# Patient Record
Sex: Male | Born: 1960 | Race: Black or African American | Hispanic: No | Marital: Single | Smoking: Current every day smoker
Health system: Southern US, Community
[De-identification: ages and names within clinical notes are randomized; demographics above are authoritative.]

## PROBLEM LIST (undated history)

## (undated) DIAGNOSIS — E7800 Pure hypercholesterolemia, unspecified: Secondary | ICD-10-CM

## (undated) DIAGNOSIS — I10 Essential (primary) hypertension: Secondary | ICD-10-CM

## (undated) DIAGNOSIS — E119 Type 2 diabetes mellitus without complications: Secondary | ICD-10-CM

## (undated) HISTORY — PX: KNEE SURGERY: SHX244

## (undated) HISTORY — PX: EYE SURGERY: SHX253

## (undated) HISTORY — PX: HAND SURGERY: SHX662

## (undated) HISTORY — PX: FOOT SURGERY: SHX648

## (undated) HISTORY — DX: Essential (primary) hypertension: I10

---

## 2006-01-10 ENCOUNTER — Emergency Department: Payer: Self-pay | Admitting: Emergency Medicine

## 2009-12-27 ENCOUNTER — Emergency Department: Payer: Self-pay | Admitting: Emergency Medicine

## 2009-12-27 IMAGING — CT CT STONE STUDY
1 of 2 series · 15 of 32 positions shown, 19 images · non-contrast
Comparison: none

REASON FOR EXAM: Flank pain with hematuria
COMMENTS:   LMP: (Male)

PROCEDURE:     CT  - CT ABDOMEN /PELVIS WO (STONE)  - [DATE]  [DATE]
RESULT:     CT abdomen and pelvis dated [DATE].
TECHNIQUE: Helical 3 mm sections were obtained from the lung bases through the pubic
symphysis without the administration of oral or intravenous contrast.

[Series 2: stone · axial · 0.71mm/px · z∈[-498,-92]mm · 15 of 153 slices shown, 19 images]
[im 12/153  soft-tissue]
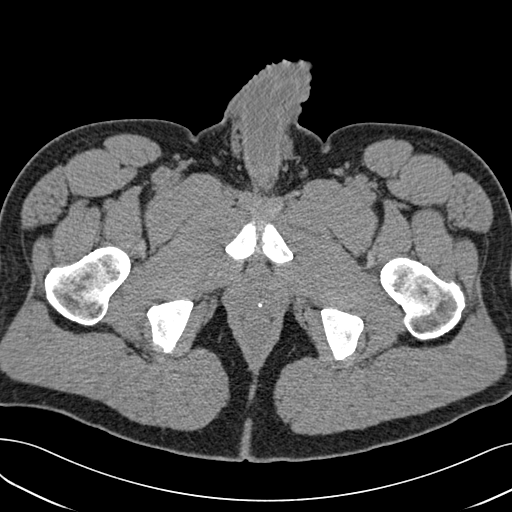
[im 12/153  bone]
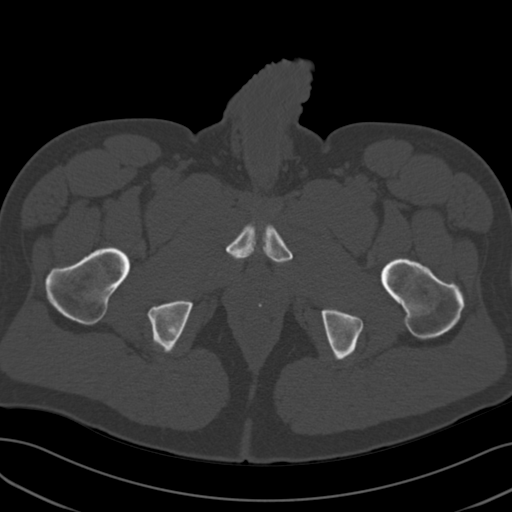
[im 23/153  soft-tissue]
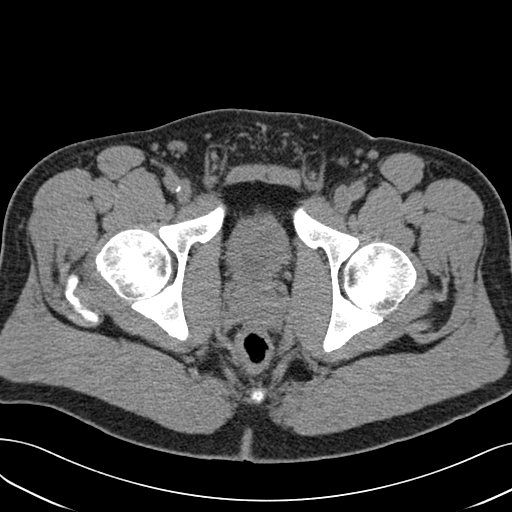
[im 34/153  soft-tissue]
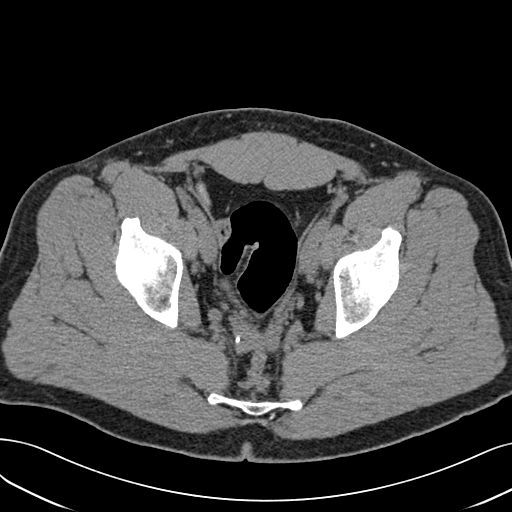
[im 46/153  soft-tissue]
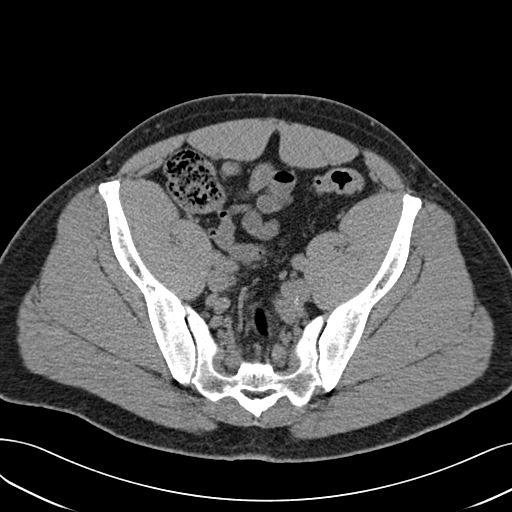
[im 57/153  soft-tissue]
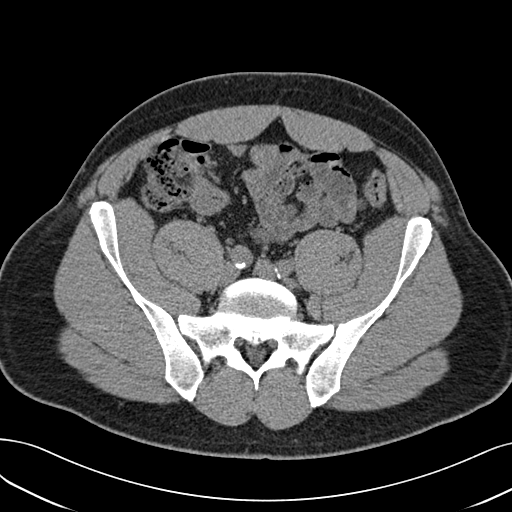
[im 68/153  soft-tissue]
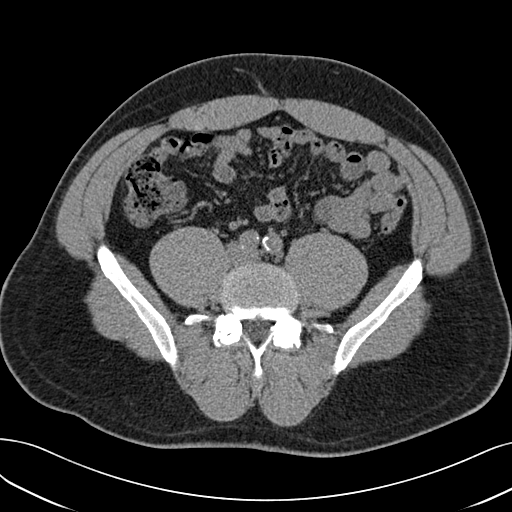
[im 79/153  soft-tissue]
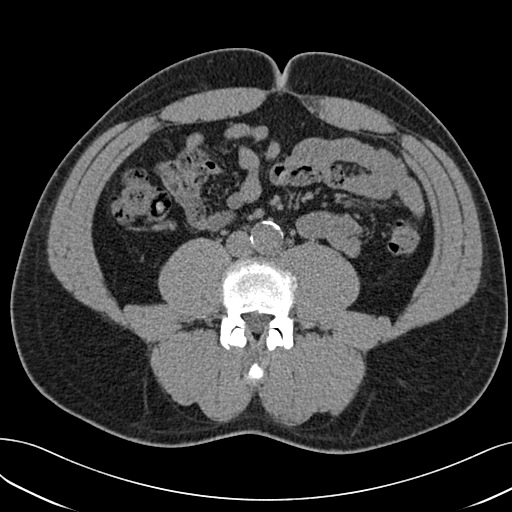
[im 91/153  soft-tissue]
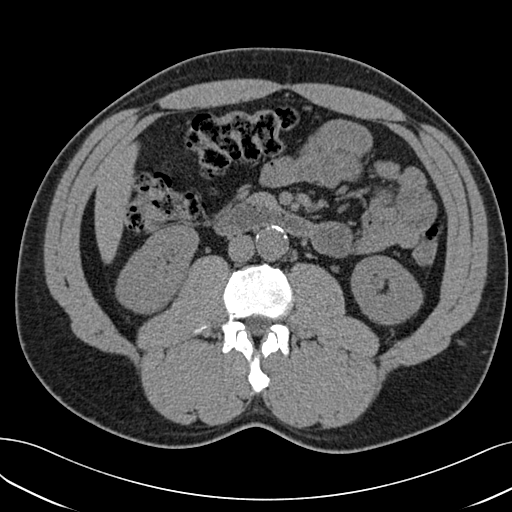
[im 102/153  soft-tissue]
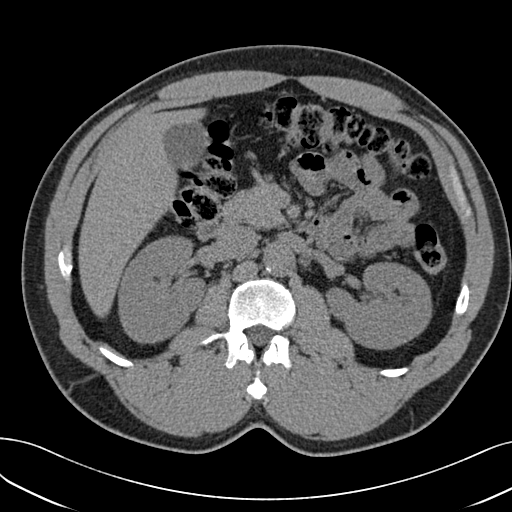
[im 102/153  bone]
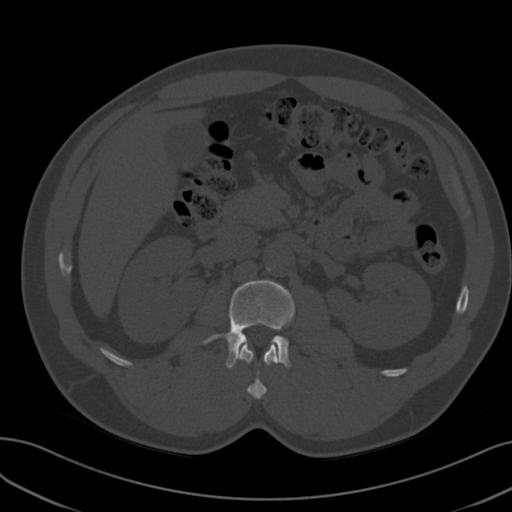
[im 113/153  soft-tissue]
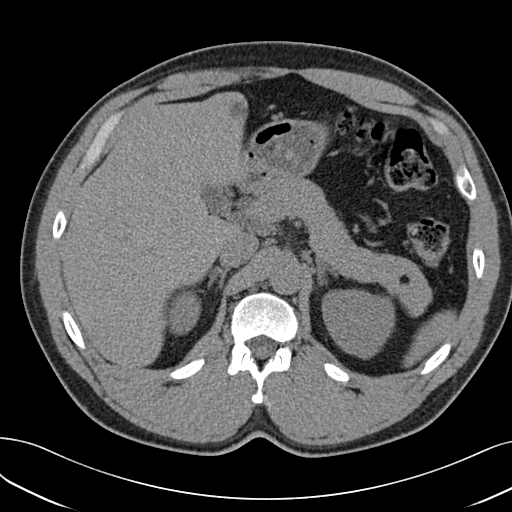
[im 124/153  soft-tissue]
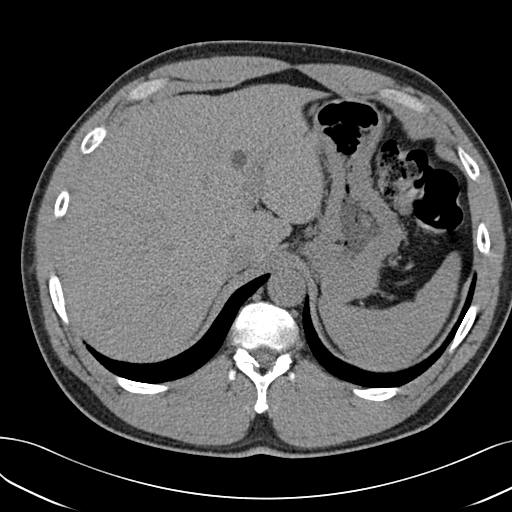
[im 130/153  lung]
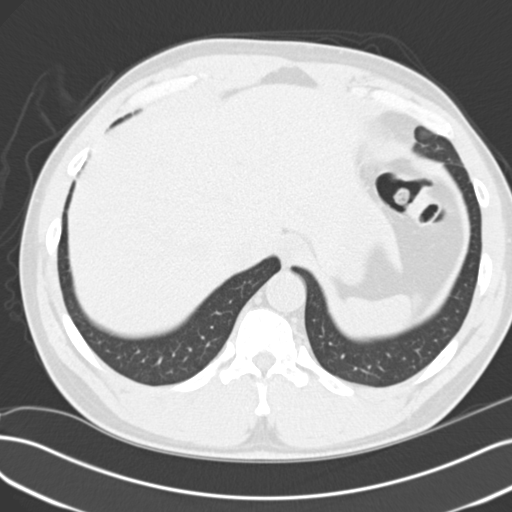
[im 136/153  soft-tissue]
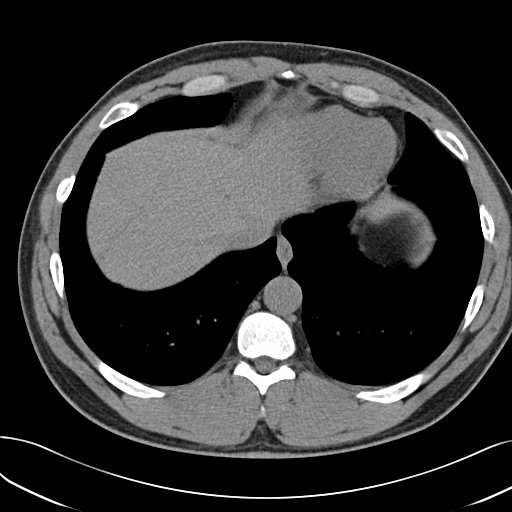
[im 136/153  lung]
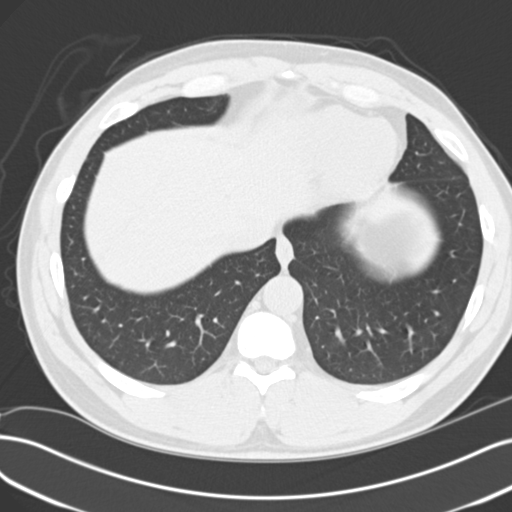
[im 141/153  lung]
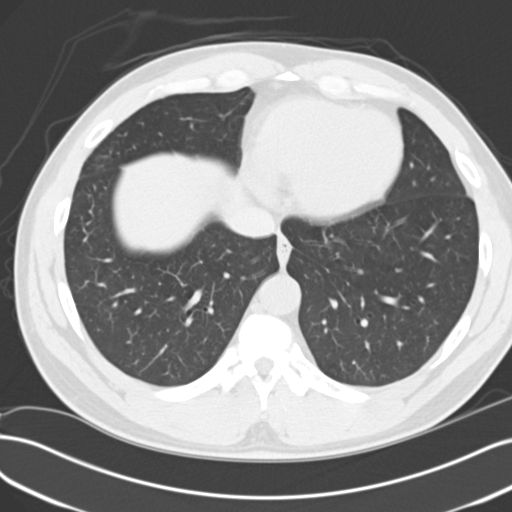
[im 147/153  soft-tissue]
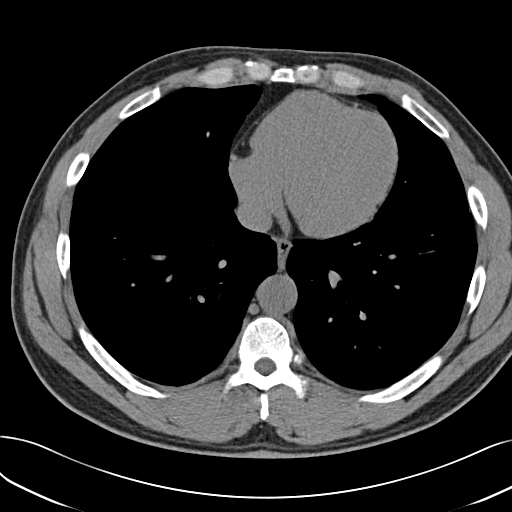
[im 147/153  lung]
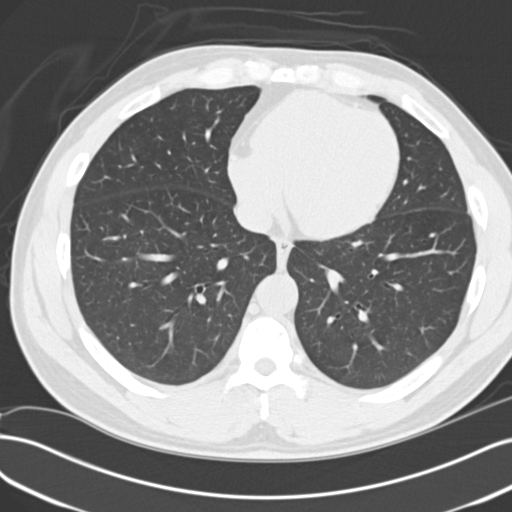

[15 of 32 positions shown; findings below may reference images not displayed]

FINDINGS: Evaluation of the lung bases demonstrates no gross abnormalities.

Evaluation of the kidneys demonstrates no evidence of hydronephrosis, mass
is, calculi. A small fleck of calcified density projects within the distal
ureter on the left at the level of the pelvic inlet. This is appreciated on
image 82. There is no evidence of hydroureter. Evaluation of the pelvis
demonstrates multiple calcified densities projecting along the expected
course of the right and left ureters. These findings likely represent the
sequela of vascular calcifications, phleboliths as well as arterial mural
calcifications. A small distal ureteral calculus cannot be completely
excluded though there is no evidence of obstructive uropathy.

The liver demonstrates multiple low attenuating foci scattered throughout
the liver likely representing multiple liver cysts. Further evaluation with
triphasic CT and/or MRI or possibly ultrasound is recommended for further
characterization. The spleen, adrenals, pancreas is unremarkable. There is
no CT evidence of bowel obstruction nor secondary signs reflecting
enteritis, colitis, diverticulitis, nor appendicitis. The appendix is
identified and is unremarkable. There is no CT evidence of abdominal aortic
aneurysm. There is no evidence of abdominal or pelvic mass is, free fluid
nor loculated fluid collections.
IMPRESSION: Fleck of calcium appreciated within the distal left ureter.
There also multiple calcified densities within the pelvis likely vascular in
nature. There is no evidence of obstructive uropathy.
2. Likely multiple cysts within the liver as described above this can be
clinically correlated if and as clinically warranted.
3. No CT evidence of inflammatory or obstructive abnormalities.

## 2013-10-03 ENCOUNTER — Other Ambulatory Visit: Payer: Self-pay

## 2013-10-03 ENCOUNTER — Encounter (HOSPITAL_COMMUNITY): Payer: Self-pay | Admitting: Emergency Medicine

## 2013-10-03 DIAGNOSIS — F17200 Nicotine dependence, unspecified, uncomplicated: Secondary | ICD-10-CM | POA: Insufficient documentation

## 2013-10-03 DIAGNOSIS — R072 Precordial pain: Secondary | ICD-10-CM | POA: Insufficient documentation

## 2013-10-03 NOTE — ED Notes (Signed)
Pt. reports intermittent left lower chest pain onset last night , denies SOB /nausea or diaphoresis , pt. stated pain worse with certain positions and movement .

## 2013-10-04 ENCOUNTER — Emergency Department (HOSPITAL_COMMUNITY)
Admission: EM | Admit: 2013-10-04 | Discharge: 2013-10-04 | Disposition: A | Discharge status: Home / Self Care | Payer: PRIVATE HEALTH INSURANCE | Attending: Emergency Medicine | Admitting: Emergency Medicine

## 2013-10-04 ENCOUNTER — Emergency Department (HOSPITAL_COMMUNITY): Payer: PRIVATE HEALTH INSURANCE | Attending: Emergency Medicine

## 2013-10-04 DIAGNOSIS — R079 Chest pain, unspecified: Secondary | ICD-10-CM

## 2013-10-04 LAB — BASIC METABOLIC PANEL
BUN: 17 mg/dL (ref 6–23)
CO2: 24 mEq/L (ref 19–32)
Calcium: 9.5 mg/dL (ref 8.4–10.5)
Creatinine, Ser: 1.16 mg/dL (ref 0.50–1.35)
GFR calc non Af Amer: 70 mL/min — ABNORMAL LOW (ref >90)
Glucose, Bld: 136 mg/dL — ABNORMAL HIGH (ref 70–99)

## 2013-10-04 LAB — CBC
HCT: 44.8 % (ref 39.0–52.0)
Hemoglobin: 15.9 g/dL (ref 13.0–17.0)
MCH: 32.9 pg (ref 26.0–34.0)
MCHC: 35.5 g/dL (ref 30.0–36.0)
MCV: 92.6 fL (ref 78.0–100.0)
RBC: 4.84 MIL/uL (ref 4.22–5.81)
RDW: 12.6 % (ref 11.5–15.5)

## 2013-10-04 LAB — D-DIMER, QUANTITATIVE: D-Dimer, Quant: <0.27 ug/mL-FEU (ref 0.00–0.48)

## 2013-10-04 LAB — POCT I-STAT TROPONIN I

## 2013-10-04 IMAGING — CR DG CHEST 2V
2 series · 2 of 2 positions shown · non-contrast
Comparison: None.

CLINICAL DATA: Smoker with chest pain.

EXAM:
CHEST  2 VIEW

[w chest pa]
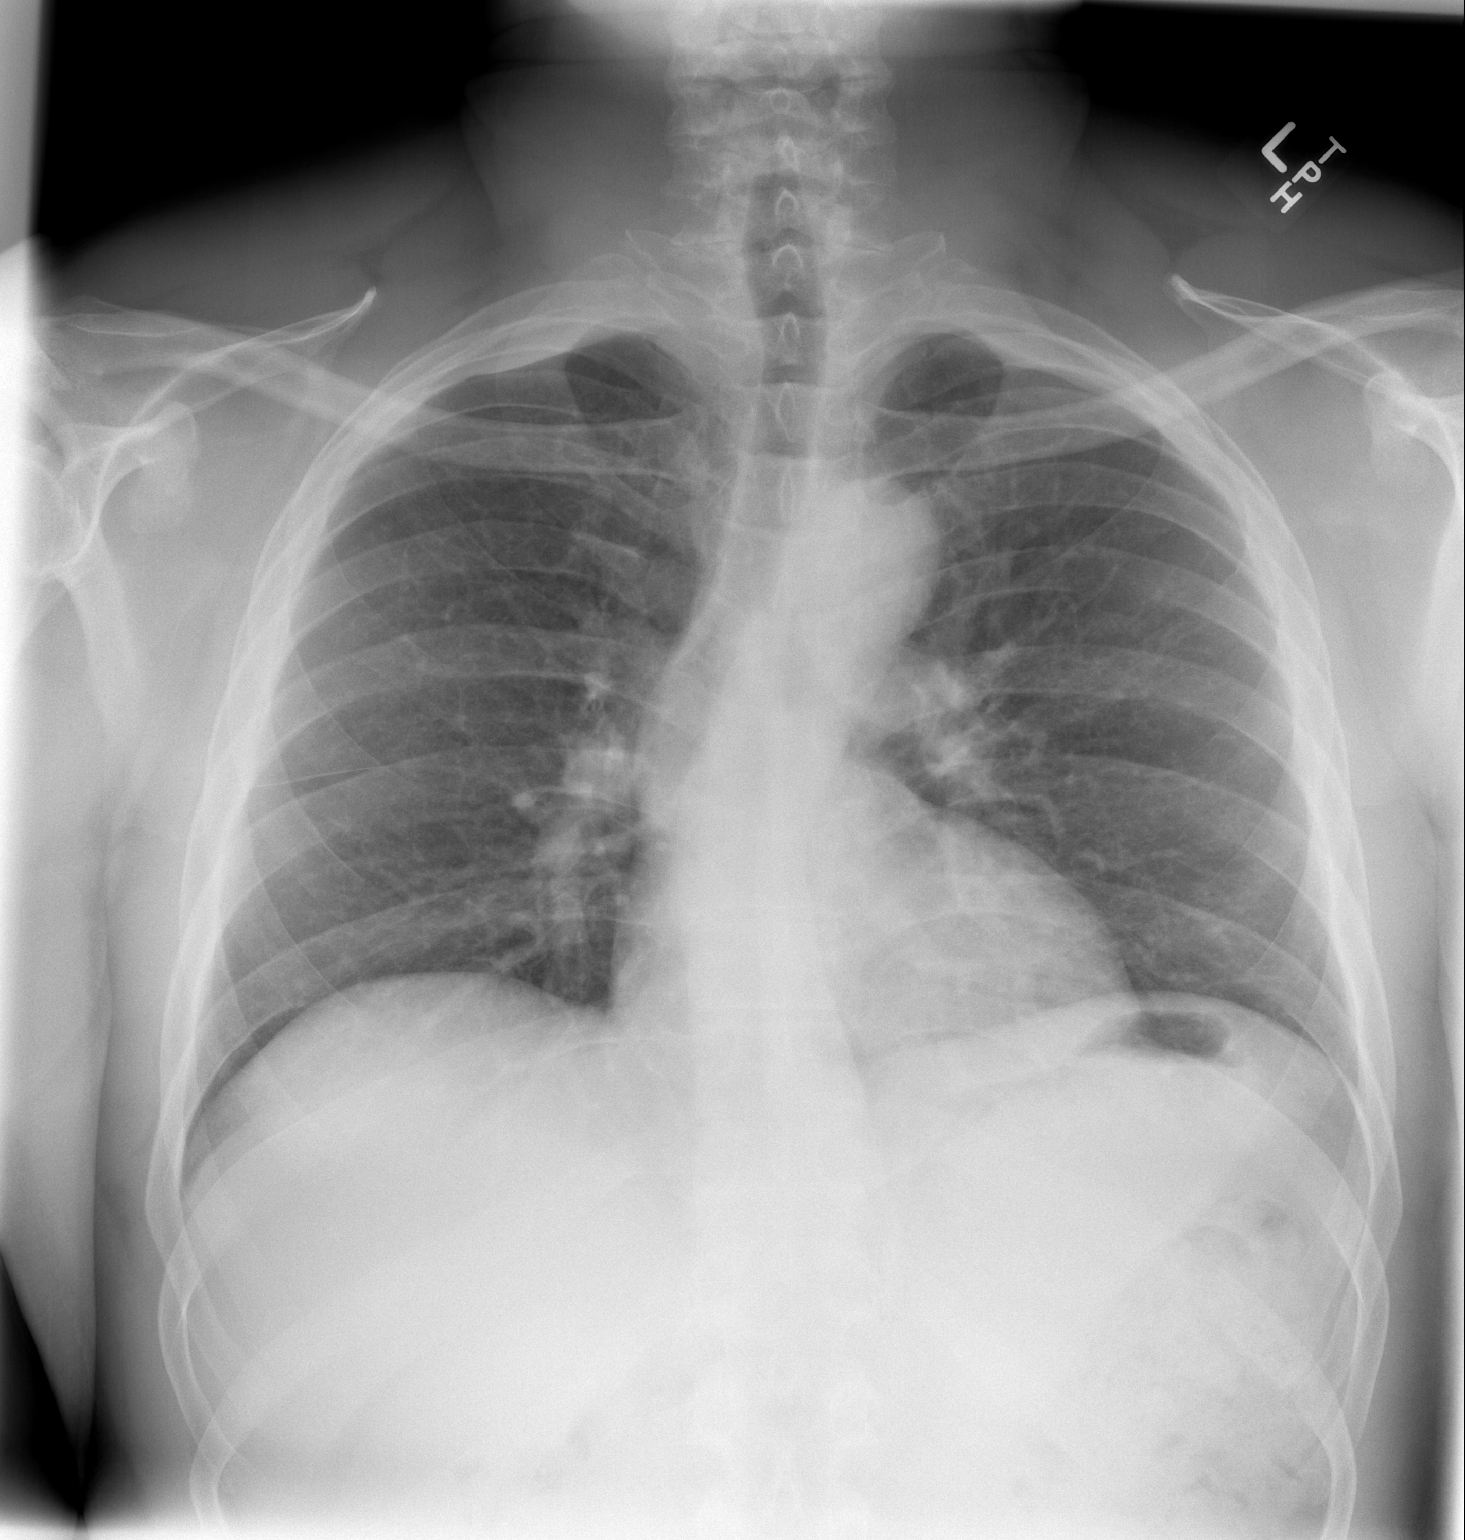

[w chest lat]
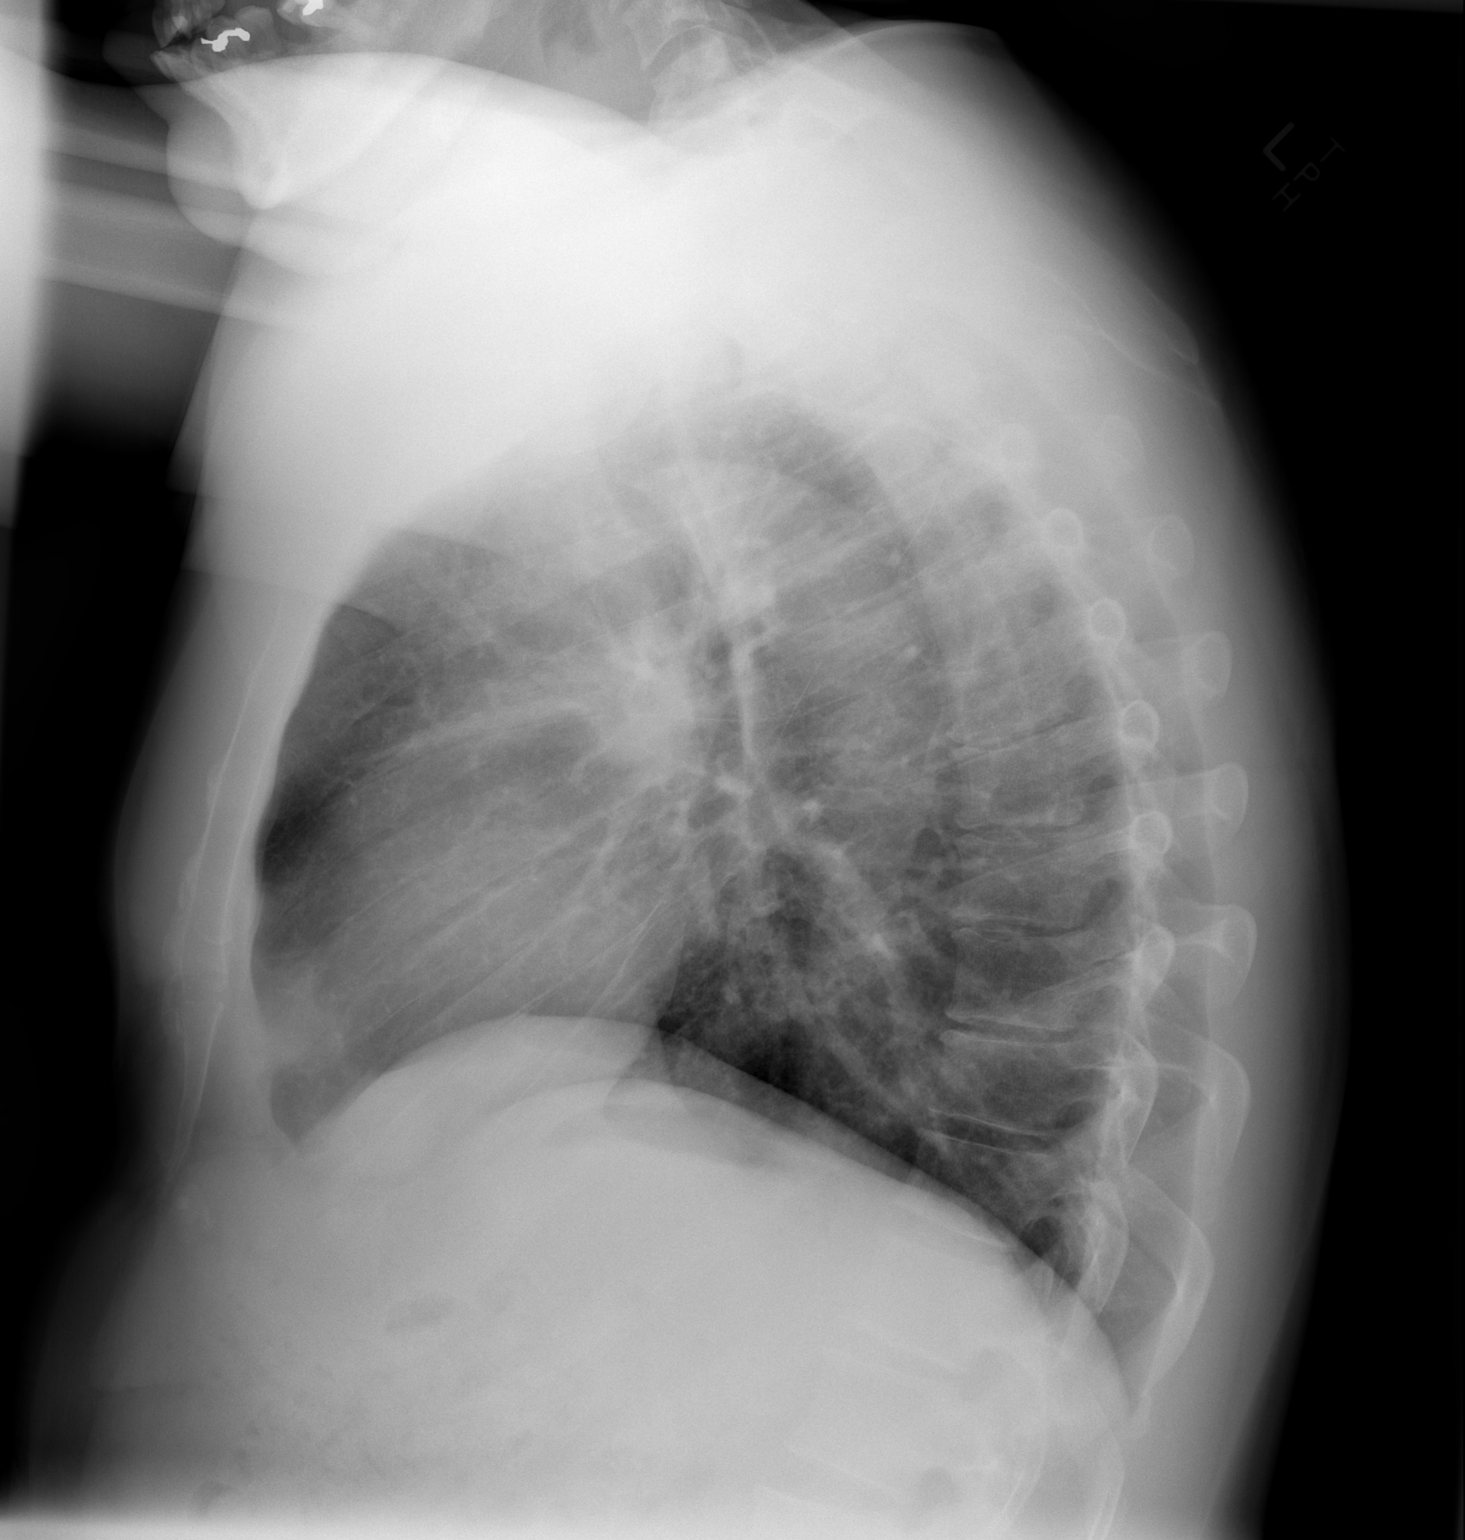

[2 of 2 positions shown; findings below may reference images not displayed]

FINDINGS: The heart size and mediastinal contours are within normal limits.
Both lungs are clear. The visualized skeletal structures are
unremarkable except for mild mid thoracic dextroscoliosis.
IMPRESSION: No active cardiopulmonary disease.

## 2013-10-04 MED ORDER — HYDROMORPHONE HCL PF 1 MG/ML IJ SOLN
1.0000 mg | Freq: Once | INTRAMUSCULAR | Status: DC
  Filled 2013-10-04: qty 1

## 2013-10-04 MED ORDER — IBUPROFEN 800 MG PO TABS: 800.0000 mg | ORAL_TABLET | Freq: Three times a day (TID) | ORAL | Status: DC

## 2013-10-04 MED ORDER — ASPIRIN 81 MG PO CHEW
324.0000 mg | CHEWABLE_TABLET | Freq: Once | ORAL | Status: DC
  Filled 2013-10-04: qty 4

## 2013-10-04 NOTE — ED Provider Notes (Signed)
CSN: [id number]     Arrival date & time 10/03/13  2352 History   First MD Initiated Contact with Patient 10/04/13 0058     Chief Complaint  Patient presents with  . Chest Pain   (Consider location/radiation/quality/duration/timing/severity/associated sxs/prior Treatment) HPI History provided by patient. Substernal and somewhat left of substernal sharp chest pain onset 2 days ago. Worse with movement of his left upper extremity and twisting. No recalled trauma. No shortness of breath, cough or fevers. No leg pain or leg swelling. No history of DVT or PE but does smoke and is a [employer]. No medications and denies any medical problems otherwise. No history of same. History reviewed. No pertinent past medical history. Past Surgical History  Procedure Laterality Date  . Knee surgery    . Foot surgery     No family history on file. History  Substance Use Topics  . Smoking status: Current Every Day Smoker  . Smokeless tobacco: Not on file  . Alcohol Use: Yes    Review of Systems  Constitutional: Negative for fever and chills.  Respiratory: Negative for shortness of breath.   Cardiovascular: Positive for chest pain.  Gastrointestinal: Negative for abdominal pain.  Genitourinary: Negative for flank pain.  Musculoskeletal: Negative for back pain, neck pain and neck stiffness.  Skin: Negative for rash.  Neurological: Negative for headaches.  All other systems reviewed and are negative.    Allergies  Review of patient's allergies indicates no known allergies.  Home Medications  No current outpatient prescriptions on file. BP 133/79  Pulse 87  Temp(Src) 97.6 F (36.4 C) (Oral)  Resp 18  SpO2 97% Physical Exam  Constitutional: He is oriented to person, place, and time. He appears well-developed and well-nourished.  HENT:  Head: Normocephalic and atraumatic.  Eyes: EOM are normal. Pupils are equal, round, and reactive to light.  Neck: Neck supple.  Cardiovascular: Normal  rate, regular rhythm and intact distal pulses.   Pulmonary/Chest: Effort normal and breath sounds normal. No respiratory distress. He exhibits no tenderness.  Pain reproducible with movement but no crepitus or tenderness to palpation  Abdominal: Soft. Bowel sounds are normal. He exhibits no distension. There is no tenderness. There is no rebound.  Musculoskeletal: Normal range of motion. He exhibits no edema.  No calf tenderness or erythema or cords  Neurological: He is alert and oriented to person, place, and time.  Skin: Skin is warm and dry.    ED Course  Procedures (including critical care time) Labs Review Labs Reviewed  CBC  BASIC METABOLIC PANEL  POCT I-STAT TROPONIN I   Imaging Review Dg Chest 2 View  10/04/2013   CLINICAL DATA:  Smoker with chest pain.  EXAM: CHEST  2 VIEW  COMPARISON:  None.  FINDINGS: The heart size and mediastinal contours are within normal limits. Both lungs are clear. The visualized skeletal structures are unremarkable except for mild mid thoracic dextroscoliosis.  IMPRESSION: No active cardiopulmonary disease.   Electronically Signed   By: Davonna Belling M.D.   On: 10/04/2013 00:20     Date: 10/04/2013  Rate: 88  Rhythm: normal sinus rhythm  QRS Axis: left  Intervals: normal  ST/T Wave abnormalities: nonspecific ST changes  Conduction Disutrbances:none  Narrative Interpretation:   Old EKG Reviewed: none available  Pain-free at time of evaluation.   Chest x-ray, labs and EKG reviewed as above. Negative d-dimer.   Patient remains pain-free in the emergency department. Given 48 hours of symptoms do not feel serial troponins  are indicated at this time. Plan outpatient followup with referral to cardiology for stress testing.  MDM  Diagnosis: Chest pain  Workup as above nonrevealing for any EKG changes, elevated cardiac enzymes or elevation of d-dimer. No acute abnormality on chest x-ray reviewed as above.  Vital signs and nursing notes reviewed and  considered    Sunnie Nielsen, MD 10/04/13 0600

## 2014-01-08 ENCOUNTER — Emergency Department (INDEPENDENT_AMBULATORY_CARE_PROVIDER_SITE_OTHER): Payer: PRIVATE HEALTH INSURANCE

## 2014-01-08 ENCOUNTER — Encounter (HOSPITAL_COMMUNITY): Payer: Self-pay | Admitting: Emergency Medicine

## 2014-01-08 ENCOUNTER — Emergency Department (INDEPENDENT_AMBULATORY_CARE_PROVIDER_SITE_OTHER)
Admission: EM | Admit: 2014-01-08 | Discharge: 2014-01-08 | Disposition: A | Discharge status: Home / Self Care | Payer: PRIVATE HEALTH INSURANCE | Source: Home / Self Care | Attending: Emergency Medicine | Admitting: Emergency Medicine

## 2014-01-08 DIAGNOSIS — IMO0002 Reserved for concepts with insufficient information to code with codable children: Secondary | ICD-10-CM

## 2014-01-08 DIAGNOSIS — S43401A Unspecified sprain of right shoulder joint, initial encounter: Secondary | ICD-10-CM

## 2014-01-08 IMAGING — CR DG SHOULDER 2+V*R*
4 series · 4 of 4 positions shown · non-contrast
Comparison: None.

CLINICAL DATA: Pain post trauma

EXAM:
RIGHT SHOULDER - 2+ VIEW

[view not recorded (1 of 4)]
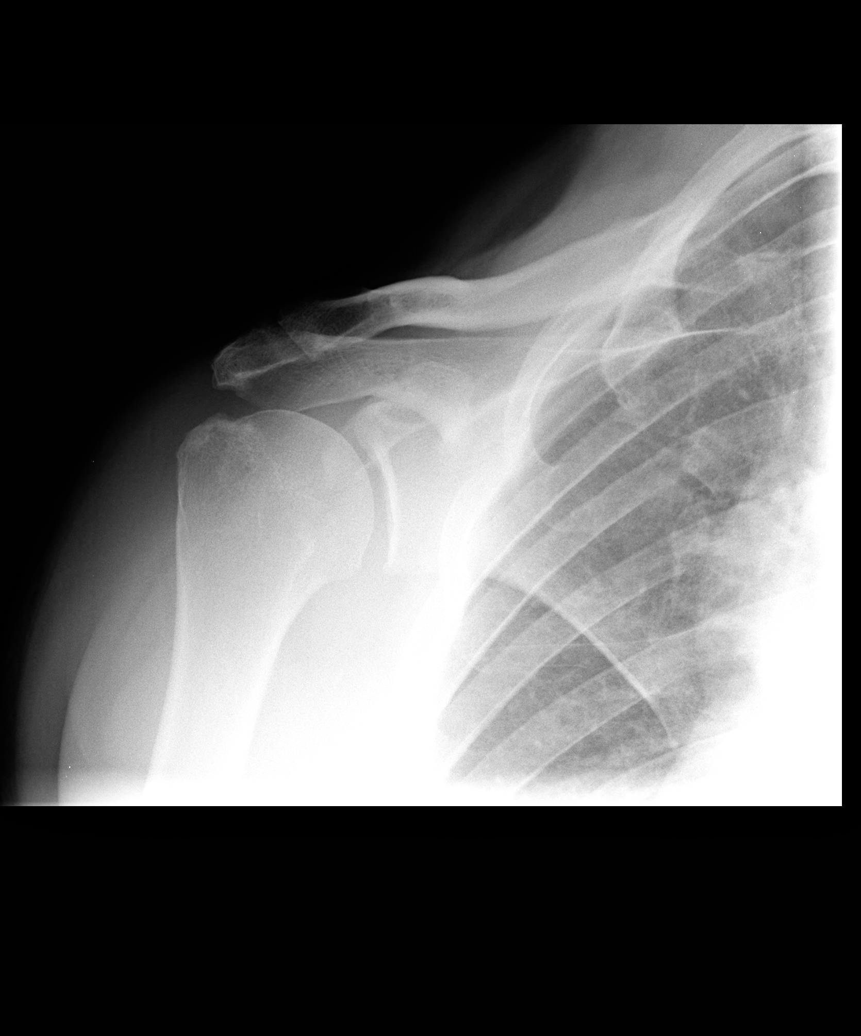

[view not recorded (2 of 4)]
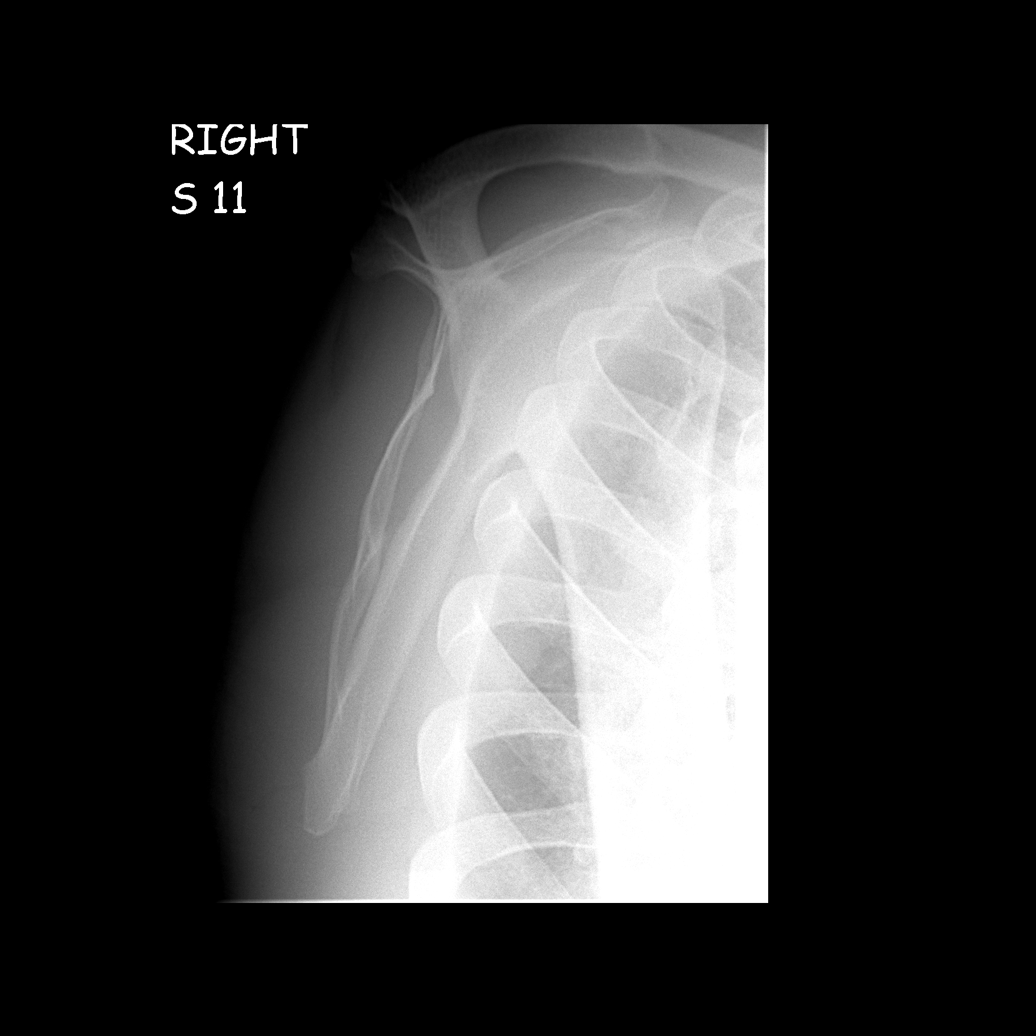

[view not recorded (3 of 4)]
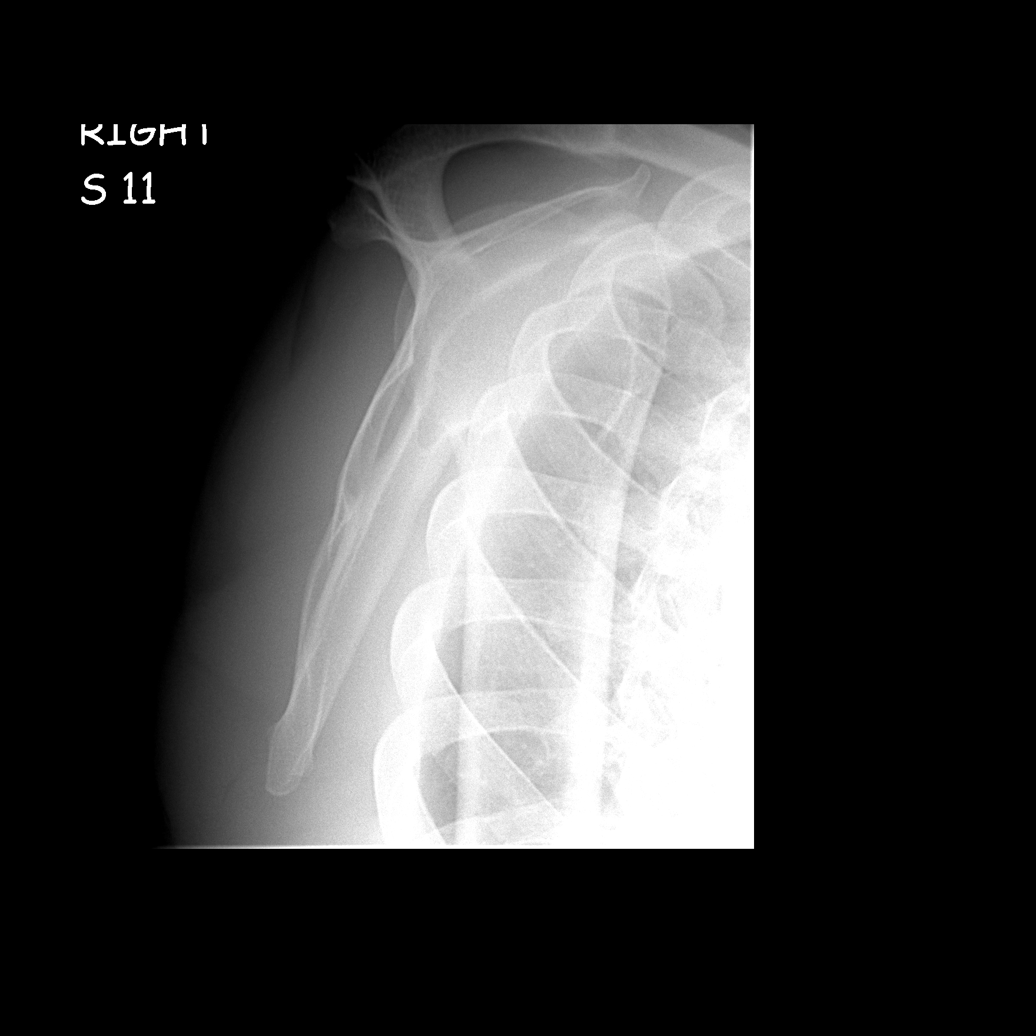

[view not recorded (4 of 4)]
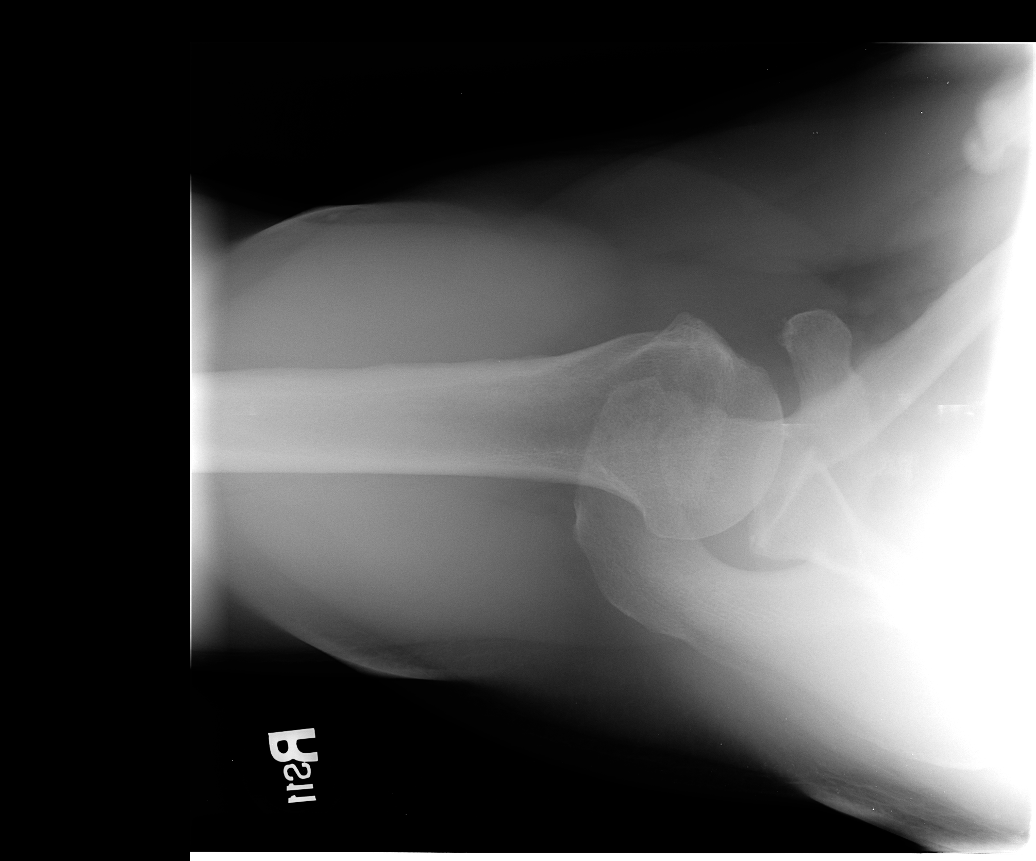

[4 of 4 positions shown; findings below may reference images not displayed]

FINDINGS: Frontal, Y scapular, and axillary images were obtained. There is no
fracture or dislocation. Joint spaces appear intact. No erosive
change.
IMPRESSION: No abnormality noted.

## 2014-01-08 MED ORDER — CYCLOBENZAPRINE HCL 5 MG PO TABS: 5.0000 mg | ORAL_TABLET | Freq: Three times a day (TID) | ORAL | Status: DC | PRN

## 2014-01-08 MED ORDER — NAPROXEN 500 MG PO TABS: 500.0000 mg | ORAL_TABLET | Freq: Two times a day (BID) | ORAL | Status: DC

## 2014-01-08 NOTE — ED Provider Notes (Signed)
CSN: [id number]     Arrival date & time 01/08/14  1214 History   First MD Initiated Contact with Patient 01/08/14 1356     Chief Complaint  Patient presents with  . Shoulder Pain   (Consider location/radiation/quality/duration/timing/severity/associated sxs/prior Treatment) HPI Comments: Pt fell of back of tractor trailer 6 weeks ago, falling backward and catching self on outstretched R arm/hand, causing injury to R shoulder.  Pt reports it is much less inflamed now and he is able to move it and use it some. AC joint area is site of majority of pain  Patient is a 53 y.o. male presenting with shoulder pain. The history is provided by the patient.  Shoulder Pain This is a new problem. Episode onset: 1.5 months ago. The problem occurs constantly. The problem has been gradually improving. Exacerbated by: rom. Nothing relieves the symptoms. He has tried nothing for the symptoms.    History reviewed. No pertinent past medical history. Past Surgical History  Procedure Laterality Date  . Knee surgery    . Foot surgery     History reviewed. No pertinent family history. History  Substance Use Topics  . Smoking status: Current Every Day Smoker -- 0.50 packs/day    Types: Cigarettes  . Smokeless tobacco: Not on file  . Alcohol Use: Yes    Review of Systems  Musculoskeletal:       R shoulder pain  Skin: Negative for wound.  Neurological: Negative for numbness.    Allergies  Review of patient's allergies indicates no known allergies.  Home Medications   Current Outpatient Rx  Name  Route  Sig  Dispense  Refill  . cyclobenzaprine (FLEXERIL) 5 MG tablet   Oral   Take 1 tablet (5 mg total) by mouth 3 (three) times daily as needed for muscle spasms.   30 tablet   0   . ibuprofen (ADVIL,MOTRIN) 800 MG tablet   Oral   Take 1 tablet (800 mg total) by mouth 3 (three) times daily.   21 tablet   0   . naproxen (NAPROSYN) 500 MG tablet   Oral   Take 1 tablet (500 mg total) by mouth 2  (two) times daily.   20 tablet   0    BP 162/100  Pulse 70  Temp(Src) 97.9 F (36.6 C) (Oral)  Resp 18  SpO2 98% Physical Exam  Constitutional: He appears well-developed and well-nourished. No distress.  Musculoskeletal:       Right shoulder: He exhibits decreased range of motion, tenderness and bony tenderness. He exhibits no swelling, no effusion, no crepitus and no deformity.  Decreased abduction, flexion and external rotation of R shoulder; pt cannot raise arm above shoulder level due to pain. AC area tender to palp. Empty can test normal.     ED Course  Procedures (including critical care time) Labs Review Labs Reviewed - No data to display Imaging Review Dg Shoulder Right  01/08/2014   CLINICAL DATA:  Pain post trauma  EXAM: RIGHT SHOULDER - 2+ VIEW  COMPARISON:  None.  FINDINGS: Frontal, Y scapular, and axillary images were obtained. There is no fracture or dislocation. Joint spaces appear intact. No erosive change.  IMPRESSION: No abnormality noted.   Electronically Signed   By: Lowella Grip M.D.   On: 01/08/2014 13:34     MDM   1. Sprain of shoulder, right   discussed elevated bp with pt; he insists it was normal a month ago. I encouraged him to check it himself  or have it checked 1-2x/week for the next 2-3 weeks and if it remains elevated, seek tx. 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 Given shoulder exercises handout and referral to ortho if sx not improving with this tx plan.      Carvel Getting, NP 01/08/14 1625

## 2014-01-08 NOTE — ED Notes (Signed)
Pt c/o right shoulder pain onset 1.5 months States he fell backward off a tractor trailer  Pain increases w/activity BP today is 162/100 -- smokes 0.5 PPD Alert w/no signs of acute distress.

## 2014-01-08 NOTE — Discharge Instructions (Signed)
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    Do the shoulder exercises twice daily. Hold stretches for 30 seconds and repeat each exercise 10 times.  If you are not getting better in the next 2 weeks, make an appointment to see the orthopedist.   Check your blood pressure yourself at a pharmacy 1-2 times per week.  If it is consistently higher than 120/80, you may need medicine to control it and you should see your doctor.

## 2014-01-08 NOTE — ED Provider Notes (Signed)
Medical screening examination/treatment/procedure(s) were performed by non-physician practitioner and as supervising physician I was immediately available for consultation/collaboration.  Kalyb Pemble, M.D.  Sallee Hogrefe C Jovanny Stephanie, MD 01/08/14 2310 

## 2014-01-19 ENCOUNTER — Encounter (HOSPITAL_COMMUNITY): Payer: Self-pay | Admitting: Emergency Medicine

## 2014-01-19 ENCOUNTER — Emergency Department (INDEPENDENT_AMBULATORY_CARE_PROVIDER_SITE_OTHER)
Admission: EM | Admit: 2014-01-19 | Discharge: 2014-01-19 | Disposition: A | Discharge status: Home / Self Care | Payer: PRIVATE HEALTH INSURANCE | Source: Home / Self Care | Attending: Emergency Medicine | Admitting: Emergency Medicine

## 2014-01-19 DIAGNOSIS — IMO0001 Reserved for inherently not codable concepts without codable children: Secondary | ICD-10-CM

## 2014-01-19 DIAGNOSIS — R030 Elevated blood-pressure reading, without diagnosis of hypertension: Secondary | ICD-10-CM

## 2014-01-19 LAB — POCT I-STAT, CHEM 8
BUN: 16 mg/dL (ref 6–23)
CREATININE: 1.1 mg/dL (ref 0.50–1.35)
Calcium, Ion: 1.17 mmol/L (ref 1.12–1.23)
Chloride: 103 mEq/L (ref 96–112)
Glucose, Bld: 166 mg/dL — ABNORMAL HIGH (ref 70–99)
HCT: 51 % (ref 39.0–52.0)
HEMOGLOBIN: 17.3 g/dL — AB (ref 13.0–17.0)
Potassium: 4 mEq/L (ref 3.7–5.3)
SODIUM: 136 meq/L — AB (ref 137–147)
TCO2: 22 mmol/L (ref 0–100)

## 2014-01-19 MED ORDER — HYDROCHLOROTHIAZIDE 12.5 MG PO TABS: 12.5000 mg | ORAL_TABLET | Freq: Every day | ORAL | Status: DC

## 2014-01-19 MED ORDER — AMLODIPINE BESYLATE 5 MG PO TABS: 5.0000 mg | ORAL_TABLET | Freq: Every day | ORAL | Status: DC

## 2014-01-19 NOTE — ED Notes (Signed)
Pt reports   He        Was  Seen  approx  1 2  Days   Ago for  Shoulder  Problems  He  Reports  His bp  Has  Been  High     Since  The  Visit           He  Reports the  Pain is  Somewhat  Better            he  Ambulated  To  Room  With a  Steady  Fluid  Gait     He  Is  Speaking in  Complete  sentances  And  Appears  In no  Severe  Distress

## 2014-01-19 NOTE — ED Provider Notes (Signed)
Chief Complaint   Chief Complaint  Patient presents with  . Hypertension    History of Present Illness   Corey Rasmussen is a 53 year old male. One month ago he fell off a trailer injuring his right shoulder. He was seen here about 2 weeks ago. X-rays were negative. He was given naproxen and Flexeril. The shoulder pain is improving. His shoulder has a good range of motion right now. His biggest concern right now is his blood pressure. It's been running from 443-154 systolic over 00-867 diastolic. He has had no prior history of high blood pressure. 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 He denies any headache but has felt somewhat dizzy at times. No blurry vision, shortness of breath, chest pain, tightness, or pressure. He is a cigarette smoker. No extremity swelling or strokelike symptoms. No history of diabetes, kidney disease, or elevated cholesterol.  Review of Systems   Other than as noted above, the patient denies any of the following symptoms: Respiratory:  No coughing, wheezing, or shortness of breath. Cardiac:  No chest pain, tightness, pressure, palpitations, syncope, or edema. Neuro:  No headache, dizziness, blurred vision, weakness, paresthesias, or strokelike symptoms.   [place]   Past medical history, family history, social history, meds, and allergies were reviewed.    Physical Examination   Vital signs:  BP 135/97  Pulse 70  Temp(Src) 98.3 F (36.8 C) (Oral)  Resp 18  SpO2 99% General:  Alert, oriented, in no distress. Lungs:  Breath sounds clear and equal bilaterally.  No wheezes, rales, or rhonchi. Heart:  Regular rhythm, no gallops, murmers, clicks or rubs.  Abdomen:  Soft and flat.  Nontender, no organomegaly or mass.  No pulsatile midline abdominal mass or bruit. Ext:  No edema, pulses full. Exam of his right shoulder reveals no pain to palpation and full range of motion with no pain on  movement. Impingement signs are negative. Neurological exam:  Alert and oriented.  Speech is clear.  No pronator drift.  CNs intact.  Labs   Results for orders placed during the hospital encounter of 01/19/14  POCT I-STAT, CHEM 8      Result Value Ref Range   Sodium 136 (*) 137 - 147 mEq/L   Potassium 4.0  3.7 - 5.3 mEq/L   Chloride 103  96 - 112 mEq/L   BUN 16  6 - 23 mg/dL   Creatinine, Ser 1.10  0.50 - 1.35 mg/dL   Glucose, Bld 166 (*) 70 - 99 mg/dL   Calcium, Ion 1.17  1.12 - 1.23 mmol/L   TCO2 22  0 - 100 mmol/L   Hemoglobin 17.3 (*) 13.0 - 17.0 g/dL   HCT 51.0  39.0 - 52.0 %    Assessment   The encounter diagnosis was Elevated blood pressure.  Additionally his blood glucose was a little bit high. I discussed this with him as well and suggested the need to followup with her primary care physician. He does not have one now but will try to get established with a primary care Dr. and followup with regard to his blood pressure and sugars as possible.  Plan   1.  Meds:  The following meds were prescribed:   Discharge Medication List as of 01/19/2014  2:56 PM    START taking these medications   Details  amLODipine (NORVASC) 5 MG tablet Take 1 tablet (5 mg total) by mouth daily., Starting 01/19/2014,  Until Discontinued, Normal    hydrochlorothiazide (HYDRODIURIL) 12.5 MG tablet Take 1 tablet (12.5 mg total) by mouth daily., Starting 01/19/2014, Until Discontinued, Normal        2.  Patient Education/Counseling:  The patient was given appropriate handouts, self care instructions, and instructed in symptomatic relief. Specifically discussed salt and sodium restriction, weight control, and exercise.   3.  Follow up:  The patient was told to follow up here if no better in 3 to 4 days, or sooner if becoming worse in any way, and given some red flag symptoms such as severe headache, vision changes, shortness of breath, chest pain or stroke like symptoms which would prompt immediate  return.      Harden Mo, MD 01/19/14 2204

## 2014-01-19 NOTE — Discharge Instructions (Signed)
Blood pressure over the ideal can put you at higher risk for stroke, heart disease, and kidney failure.  For this reason, it's important to try to get your blood pressure as close as possible to the ideal.  The ideal blood pressure is 120/80.  Blood pressures from 469-629 systolic over 52-84 diastolic are labeled as "prehypertension."  This means you are at higher risk of developing hypertension in the future.  Blood pressures in this range are not treated with medication, but lifestyle changes are recommended to prevent progression to hypertension.  Blood pressures of 132 and above systolic over 90 and above diastolic are classified as hypertension and are treated with medications.  Lifestyle changes which can benefit both prehypertension and hypertension include the following:   Salt and sodium restriction.  Weight loss.  Regular exercise.  Avoidance of tobacco.  Avoidance of excess alcohol.  The "D.A.S.H" diet.   People with hypertension and prehypertension should limit their salt intake to less than 1500 mg daily.  Reading the nutrition information on the label of many prepared foods can give you an idea of how much sodium you're consuming at each meal.  Remember that the most important number on the nutrition information is the serving size.  It may be smaller than you think.  Try to avoid adding extra salt at the table.  You may add small amounts of salt while cooking.  Remember that salt is an acquired taste and you may get used to a using a whole lot less salt than you are using now.  Using less salt lets the food's natural flavors come through.  You might want to consider using salt substitutes, potassium chloride, pepper, or blends of herbs and spices to enhance the flavor of your food.  Foods that contain the most salt include: processed meats (like ham, bacon, lunch meat, sausage, hot dogs, and breakfast meat), chips, pretzels, salted nuts, soups, salty snacks, canned foods, junk  food, fast food, restaurant food, mustard, pickles, pizza, popcorn, soy sauce, and worcestershire sauce--quite a list!  You might ask, "Is there anything I can eat?"  The answer is, "yes."  Fruits and vegetables are usually low in salt.  Fresh is better than frozen which is better than canned.  If you have canned vegetables, you can cut down on the salt content by rinsing them in tap water 3 times before cooking.     Weight loss is the second thing you can do to lower your blood pressure.  Getting to and maintaining ideal weight will often normalize your blood pressure and allow you to avoid medications, entirely, cut way down on your dosage of medications, or allow to wean off your meds.  (Note, this should only be done under the supervision of your primary care doctor.)  Of course, weight loss takes time and you may need to be on medication in the meantime.  You shoot for a body mass index of 20-25.  When you go to the urgent care or to your primary care doctor, they should calculate your BMI.  If you don't know what it is, ask.  You can calculate your BMI with the following formula:  Weight in pounds x 703/ (height in inches) x (height in inches).  There are many good diets out there: Weight Watchers and the D.A.S.H. Diet are the best, but often, just modifying a few factors can be helpful:  Don't skip meals, don't eat out, and keeping a food diary.  I do not recommend  fad diets or diet pills which often raise blood pressure.  ° °· Everyone should get regular exercise, but this is particularly important for people with high blood pressure.  Just about any exercise is good.  The only exercise which may be harmful is lifting extreme heavy weights.  I recommend moderate exercise such as walking for 30 minutes 5 days a week.  Going to the gym for a 50 minute workout 3 times a week is also good.  This amounts to 150 minutes of exercise weekly. ° °· Anyone with high blood pressure should avoid any use of tobacco.   Tobacco use does not elevate blood pressure, but it increases the risk of heart disease and stroke.  If you are interested in quitting, discuss with your doctor how to quit.  If you are not interested in quitting, ask yourself, "What would my life be like in 10 years if I continue to smoke?"  "How will I know when it is time to quit?"  "How would my life be better if I were to quit." ° °· Excess alcohol intake can raise the blood pressure.  The safe alcohol intake is 2 drinks or less per day for men and 1 drink per day or less for women. ° °· There is a very good diet which I recommend that has been designed for people with blood pressure called the D.A.S.H. Diet (dietary approaches to stop hypertension).  It consists of fruits, vegetables, lean meats, low fat dairy, whole grains, nuts and seeds.  It is very low in salt and sodium.  It has also been found to have other beneficial health effects such as lowering cholesterol and helping lose weight.  It has been developed by the [employer] of [employer] and can be downloaded from the internet without any cost. Just do a web search on "D.A.S.H. Diet." or go the NIH website (www.nih.gov).  There are also cookbooks and diet plans that can be gotten from [employer] to help you with this diet. °·  °

## 2014-09-22 ENCOUNTER — Ambulatory Visit (INDEPENDENT_AMBULATORY_CARE_PROVIDER_SITE_OTHER): Payer: Self-pay | Admitting: Emergency Medicine

## 2014-09-22 VITALS — BP 126/82 | HR 86 | Temp 97.8°F | Resp 16 | Ht 69.25 in | Wt 220.0 lb

## 2014-09-22 DIAGNOSIS — Z021 Encounter for pre-employment examination: Secondary | ICD-10-CM

## 2014-09-22 NOTE — Progress Notes (Signed)
[facility] [address] [place] [address] [phone]  Date:  09/22/2014   Name:  Corey Rasmussen   DOB:  November 13, 1960   MRN:  [id number]  PCP:  No PCP Per Patient    Chief Complaint: Employment Physical   History of Present Illness:  Corey Rasmussen is a 53 y.o. very pleasant male patient who presents with the following:  DOT   There are no active problems to display for this patient.   Past Medical History  Diagnosis Date  . Hypertension     Past Surgical History  Procedure Laterality Date  . Knee surgery    . Foot surgery      History  Substance Use Topics  . Smoking status: Current Every Day Smoker -- 0.50 packs/day    Types: Cigarettes  . Smokeless tobacco: Not on file  . Alcohol Use: Yes    History reviewed. No pertinent family history.  No Known Allergies  Medication list has been reviewed and updated.  Current Outpatient Prescriptions on File Prior to Visit  Medication Sig Dispense Refill  . amLODipine (NORVASC) 5 MG tablet Take 1 tablet (5 mg total) by mouth daily. 30 tablet 3  . cyclobenzaprine (FLEXERIL) 5 MG tablet Take 1 tablet (5 mg total) by mouth 3 (three) times daily as needed for muscle spasms. 30 tablet 0  . hydrochlorothiazide (HYDRODIURIL) 12.5 MG tablet Take 1 tablet (12.5 mg total) by mouth daily. 30 tablet 3  . ibuprofen (ADVIL,MOTRIN) 800 MG tablet Take 1 tablet (800 mg total) by mouth 3 (three) times daily. 21 tablet 0  . naproxen (NAPROSYN) 500 MG tablet Take 1 tablet (500 mg total) by mouth 2 (two) times daily. 20 tablet 0   No current facility-administered medications on file prior to visit.    Review of Systems:  As per HPI, otherwise negative.    Physical Examination: Filed Vitals:   09/22/14 1529  BP: 126/82  Pulse: 86  Temp: 97.8 F (36.6 C)  Resp: 16   Filed Vitals:   09/22/14 1529  Height: 5' 9.25" (1.759 m)  Weight: 220 lb (99.791 kg)   Body mass index is 32.25 kg/(m^2). Ideal  Body Weight: Weight in (lb) to have BMI = 25: 170.2  GEN: WDWN, NAD, Non-toxic, A & O x 3 HEENT: Atraumatic, Normocephalic. Neck supple. No masses, No LAD. Ears and Nose: No external deformity. CV: RRR, No M/G/R. No JVD. No thrill. No extra heart sounds. PULM: CTA B, no wheezes, crackles, rhonchi. No retractions. No resp. distress. No accessory muscle use. ABD: S, NT, ND, +BS. No rebound. No HSM. EXTR: No c/c/e NEURO Normal gait.  PSYCH: Normally interactive. Conversant. Not depressed or anxious appearing.  Calm demeanor.    Assessment and Plan: DOT NIDDM NBP  Signed,  Ellison Carwin, MD

## 2015-02-08 ENCOUNTER — Ambulatory Visit: Payer: PRIVATE HEALTH INSURANCE | Admitting: Nurse Practitioner

## 2015-02-16 ENCOUNTER — Ambulatory Visit: Payer: PRIVATE HEALTH INSURANCE | Admitting: Family

## 2015-03-08 ENCOUNTER — Ambulatory Visit: Payer: PRIVATE HEALTH INSURANCE | Admitting: Family

## 2015-05-01 ENCOUNTER — Encounter: Payer: Self-pay | Admitting: Emergency Medicine

## 2015-05-01 ENCOUNTER — Emergency Department
Admission: EM | Admit: 2015-05-01 | Discharge: 2015-05-01 | Disposition: A | Discharge status: Home / Self Care | Payer: Self-pay | Attending: Emergency Medicine | Admitting: Emergency Medicine

## 2015-05-01 DIAGNOSIS — Z79899 Other long term (current) drug therapy: Secondary | ICD-10-CM | POA: Insufficient documentation

## 2015-05-01 DIAGNOSIS — Z791 Long term (current) use of non-steroidal anti-inflammatories (NSAID): Secondary | ICD-10-CM | POA: Insufficient documentation

## 2015-05-01 DIAGNOSIS — L309 Dermatitis, unspecified: Secondary | ICD-10-CM | POA: Insufficient documentation

## 2015-05-01 DIAGNOSIS — E119 Type 2 diabetes mellitus without complications: Secondary | ICD-10-CM | POA: Insufficient documentation

## 2015-05-01 DIAGNOSIS — I10 Essential (primary) hypertension: Secondary | ICD-10-CM | POA: Insufficient documentation

## 2015-05-01 DIAGNOSIS — Z720 Tobacco use: Secondary | ICD-10-CM | POA: Insufficient documentation

## 2015-05-01 HISTORY — DX: Pure hypercholesterolemia, unspecified: E78.00

## 2015-05-01 HISTORY — DX: Type 2 diabetes mellitus without complications: E11.9

## 2015-05-01 MED ORDER — HYDROCORTISONE 1 % EX OINT: 1.0000 "application " | TOPICAL_OINTMENT | Freq: Two times a day (BID) | CUTANEOUS | Status: DC

## 2015-05-01 NOTE — Discharge Instructions (Signed)
1. Apply hydrocortisone ointment to affected areas twice daily as needed. 2. Return to the ER for worsening symptoms, fever, difficulty breathing or other concerns.  Contact Dermatitis Contact dermatitis is a reaction to certain substances that touch the skin. Contact dermatitis can be either irritant contact dermatitis or allergic contact dermatitis. Irritant contact dermatitis does not require previous exposure to the substance for a reaction to occur.Allergic contact dermatitis only occurs if you have been exposed to the substance before. Upon a repeat exposure, your body reacts to the substance.  CAUSES  Many substances can cause contact dermatitis. Irritant dermatitis is most commonly caused by repeated exposure to mildly irritating substances, such as:  Makeup.  Soaps.  Detergents.  Bleaches.  Acids.  Metal salts, such as nickel. Allergic contact dermatitis is most commonly caused by exposure to:  Poisonous plants.  Chemicals (deodorants, shampoos).  Jewelry.  Latex.  Neomycin in triple antibiotic cream.  Preservatives in products, including clothing. SYMPTOMS  The area of skin that is exposed may develop:  Dryness or flaking.  Redness.  Cracks.  Itching.  Pain or a burning sensation.  Blisters. With allergic contact dermatitis, there may also be swelling in areas such as the eyelids, mouth, or genitals.  DIAGNOSIS  Your caregiver can usually tell what the problem is by doing a physical exam. In cases where the cause is uncertain and an allergic contact dermatitis is suspected, a patch skin test may be performed to help determine the cause of your dermatitis. TREATMENT Treatment includes protecting the skin from further contact with the irritating substance by avoiding that substance if possible. Barrier creams, powders, and gloves may be helpful. Your caregiver may also recommend:  Steroid creams or ointments applied 2 times daily. For best results, soak  the rash area in cool water for 20 minutes. Then apply the medicine. Cover the area with a plastic wrap. You can store the steroid cream in the refrigerator for a "chilly" effect on your rash. That may decrease itching. Oral steroid medicines may be needed in more severe cases.  Antibiotics or antibacterial ointments if a skin infection is present.  Antihistamine lotion or an antihistamine taken by mouth to ease itching.  Lubricants to keep moisture in your skin.  Burow's solution to reduce redness and soreness or to dry a weeping rash. Mix one packet or tablet of solution in 2 cups cool water. Dip a clean washcloth in the mixture, wring it out a bit, and put it on the affected area. Leave the cloth in place for 30 minutes. Do this as often as possible throughout the day.  Taking several cornstarch or baking soda baths daily if the area is too large to cover with a washcloth. Harsh chemicals, such as alkalis or acids, can cause skin damage that is like a burn. You should flush your skin for 15 to 20 minutes with cold water after such an exposure. You should also seek immediate medical care after exposure. Bandages (dressings), antibiotics, and pain medicine may be needed for severely irritated skin.  HOME CARE INSTRUCTIONS  Avoid the substance that caused your reaction.  Keep the area of skin that is affected away from hot water, soap, sunlight, chemicals, acidic substances, or anything else that would irritate your skin.  Do not scratch the rash. Scratching may cause the rash to become infected.  You may take cool baths to help stop the itching.  Only take over-the-counter or prescription medicines as directed by your caregiver.  See your caregiver  for follow-up care as directed to make sure your skin is healing properly. SEEK MEDICAL CARE IF:   Your condition is not better after 3 days of treatment.  You seem to be getting worse.  You see signs of infection such as swelling,  tenderness, redness, soreness, or warmth in the affected area.  You have any problems related to your medicines. Document Released: 10/06/2000 Document Revised: 01/01/2012 Document Reviewed: 03/14/2011 [facility] Patient Information 2015 [place], [place]. This information is not intended to replace advice given to you by your health care provider. Make sure you discuss any questions you have with your health care provider.  Eczema Eczema, also called atopic dermatitis, is a skin disorder that causes inflammation of the skin. It causes a red rash and dry, scaly skin. The skin becomes very itchy. Eczema is generally worse during the cooler winter months and often improves with the warmth of summer. Eczema usually starts showing signs in infancy. Some children outgrow eczema, but it may last through adulthood.  CAUSES  The exact cause of eczema is not known, but it appears to run in families. People with eczema often have a family history of eczema, allergies, asthma, or hay fever. Eczema is not contagious. Flare-ups of the condition may be caused by:   Contact with something you are sensitive or allergic to.   Stress. SIGNS AND SYMPTOMS  Dry, scaly skin.   Red, itchy rash.   Itchiness. This may occur before the skin rash and may be very intense.  DIAGNOSIS  The diagnosis of eczema is usually made based on symptoms and medical history. TREATMENT  Eczema cannot be cured, but symptoms usually can be controlled with treatment and other strategies. A treatment plan might include:  Controlling the itching and scratching.   Use over-the-counter antihistamines as directed for itching. This is especially useful at night when the itching tends to be worse.   Use over-the-counter steroid creams as directed for itching.   Avoid scratching. Scratching makes the rash and itching worse. It may also result in a skin infection (impetigo) due to a break in the skin caused by scratching.   Keeping  the skin well moisturized with creams every day. This will seal in moisture and help prevent dryness. Lotions that contain alcohol and water should be avoided because they can dry the skin.   Limiting exposure to things that you are sensitive or allergic to (allergens).   Recognizing situations that cause stress.   Developing a plan to manage stress.  HOME CARE INSTRUCTIONS   Only take over-the-counter or prescription medicines as directed by your health care provider.   Do not use anything on the skin without checking with your health care provider.   Keep baths or showers short (5 minutes) in warm (not hot) water. Use mild cleansers for bathing. These should be unscented. You may add nonperfumed bath oil to the bath water. It is best to avoid soap and bubble bath.   Immediately after a bath or shower, when the skin is still damp, apply a moisturizing ointment to the entire body. This ointment should be a petroleum ointment. This will seal in moisture and help prevent dryness. The thicker the ointment, the better. These should be unscented.   Keep fingernails cut short. Children with eczema may need to wear soft gloves or mittens at night after applying an ointment.   Dress in clothes made of cotton or cotton blends. Dress lightly, because heat increases itching.   A child with  eczema should stay away from anyone with fever blisters or cold sores. The virus that causes fever blisters (herpes simplex) can cause a serious skin infection in children with eczema. SEEK MEDICAL CARE IF:   Your itching interferes with sleep.   Your rash gets worse or is not better within 1 week after starting treatment.   You see pus or soft yellow scabs in the rash area.   You have a fever.   You have a rash flare-up after contact with someone who has fever blisters.  Document Released: 10/06/2000 Document Revised: 07/30/2013 Document Reviewed: 05/12/2013 [facility] Patient Information  2015 [place], [place]. This information is not intended to replace advice given to you by your health care provider. Make sure you discuss any questions you have with your health care provider.

## 2015-05-01 NOTE — ED Notes (Signed)
Patient reports rash "and its been moving" for "months".

## 2015-05-01 NOTE — ED Notes (Signed)
Pt states the rash itches worse in hot weather than when the weather is cold.

## 2015-05-01 NOTE — ED Notes (Signed)
Pt presents to ED with c/o rash "that's moving". Pt reports the rash started 8 months ago on his legs and has spread to his abdomen and arms. Pt denies pain but states the rash itches.

## 2015-05-01 NOTE — ED Provider Notes (Signed)
[facility] Emergency Department Provider Note  ____________________________________________  Time seen: Approximately 3:37 AM  I have reviewed the triage vital signs and the nursing notes.   HISTORY  Chief Complaint Rash    HPI Corey Rasmussen is a 54 y.o. male who presents to the ED from home complaining of a 8 month history of rash. Patient reports the rash started initially on his legs, and now has sporadically spread to his trunk and arms. States his PCP placed him on nystatin which did not help. Patient reports starting Lotrimin which helps somewhat. Patient denies pain, but states the rash is itchy especially in hot weather. If he goes indoors to the air conditioning then he is not bothered by the rash. Patient denies recent tick bite.Denies fever, chills, chest pain, shortness of breath, abdominal pain, vomiting, diarrhea.   Past Medical History  Diagnosis Date  . Hypertension   . Diabetes mellitus without complication   . Hypercholesteremia     There are no active problems to display for this patient.   Past Surgical History  Procedure Laterality Date  . Knee surgery    . Foot surgery    . Eye surgery    . Hand surgery Right     Current Outpatient Rx  Name  Route  Sig  Dispense  Refill  . cyclobenzaprine (FLEXERIL) 5 MG tablet   Oral   Take 1 tablet (5 mg total) by mouth 3 (three) times daily as needed for muscle spasms.   30 tablet   0   . ibuprofen (ADVIL,MOTRIN) 800 MG tablet   Oral   Take 1 tablet (800 mg total) by mouth 3 (three) times daily.   21 tablet   0   . metFORMIN (GLUCOPHAGE) 500 MG tablet   Oral   Take 1,000 mg by mouth 2 (two) times daily with a meal.         . naproxen (NAPROSYN) 500 MG tablet   Oral   Take 1 tablet (500 mg total) by mouth 2 (two) times daily.   20 tablet   0   . amLODipine (NORVASC) 5 MG tablet   Oral   Take 1 tablet (5 mg total) by mouth daily.   30 tablet   3   .  hydrochlorothiazide (HYDRODIURIL) 12.5 MG tablet   Oral   Take 1 tablet (12.5 mg total) by mouth daily.   30 tablet   3     Allergies Review of patient's allergies indicates no known allergies.  No family history on file.  Social History History  Substance Use Topics  . Smoking status: Current Every Day Smoker -- 0.50 packs/day    Types: Cigarettes  . Smokeless tobacco: Not on file  . Alcohol Use: Yes    Review of Systems Constitutional: No fever/chills Eyes: No visual changes. ENT: No sore throat. Cardiovascular: Denies chest pain. Respiratory: Denies shortness of breath. Gastrointestinal: No abdominal pain.  No nausea, no vomiting.  No diarrhea.  No constipation. Genitourinary: Negative for dysuria. Musculoskeletal: Negative for back pain. Skin: Positive for rash. Neurological: Negative for headaches, focal weakness or numbness.  10-point ROS otherwise negative.  ____________________________________________   PHYSICAL EXAM:  VITAL SIGNS: ED Triage Vitals  Enc Vitals Group     BP 05/01/15 0229 131/89 mmHg     Pulse Rate 05/01/15 0229 77     Resp 05/01/15 0229 18     Temp 05/01/15 0229 97.5 F (36.4 C)     Temp Source  05/01/15 0229 Oral     SpO2 05/01/15 0229 98 %     Weight 05/01/15 0229 222 lb (100.699 kg)     Height 05/01/15 0229 5\' 11"  (1.803 m)     Head Cir --      Peak Flow --      Pain Score 05/01/15 0229 0     Pain Loc --      Pain Edu? --      Excl. in [place]? --     Constitutional: Alert and oriented. Well appearing and in no acute distress. Eyes: Conjunctivae are normal. PERRL. EOMI. Head: Atraumatic. Nose: No congestion/rhinnorhea. Mouth/Throat: Mucous membranes are moist.  Oropharynx non-erythematous. Neck: No stridor.   Cardiovascular: Normal rate, regular rhythm. Grossly normal heart sounds.  Good peripheral circulation. Respiratory: Normal respiratory effort.  No retractions. Lungs CTAB. Gastrointestinal: Soft and nontender. No  distention. No abdominal bruits. No CVA tenderness. Musculoskeletal: No lower extremity tenderness nor edema.  No joint effusions. Neurologic:  Normal speech and language. No gross focal neurologic deficits are appreciated. Speech is normal. No gait instability. Skin:  Skin is warm, dry and intact. Patchy areas of eczematous dry rash noted to left lower leg, right inner thigh, trunk and multiple maculopapular areas to left elbow. No vesicles or petechiae noted. Lesions are without warmth and erythema. Psychiatric: Mood and affect are normal. Speech and behavior are normal.  ____________________________________________   LABS (all labs ordered are listed, but only abnormal results are displayed)  Labs Reviewed - No data to display ____________________________________________  EKG  None ____________________________________________  RADIOLOGY  None ____________________________________________   PROCEDURES  Procedure(s) performed: None  Critical Care performed: No  ____________________________________________   INITIAL IMPRESSION / ASSESSMENT AND PLAN / ED COURSE  Pertinent labs & imaging results that were available during my care of the patient were reviewed by me and considered in my medical decision making (see chart for details).  54 year old male who presents with a 8 month history of rash. Majority of rash looks like eczema. Left elbow looks like heat dermatitis. Given patient's history of diabetes and patchy skin lesions, will hold off on steroid taper. Advised 1% hydrocortisone cream applied to left elbow. Strict return precautions given. Patient verbalizes understanding and agrees with plan of care. ____________________________________________   FINAL CLINICAL IMPRESSION(S) / ED DIAGNOSES  Final diagnoses:  Eczema  Dermatitis      Paulette Blanch, MD 05/01/15 [phone]

## 2016-03-26 ENCOUNTER — Emergency Department
Admission: EM | Admit: 2016-03-26 | Discharge: 2016-03-26 | Disposition: A | Discharge status: Home / Self Care | Payer: Self-pay | Attending: Emergency Medicine | Admitting: Emergency Medicine

## 2016-03-26 ENCOUNTER — Encounter: Payer: Self-pay | Admitting: Emergency Medicine

## 2016-03-26 DIAGNOSIS — E119 Type 2 diabetes mellitus without complications: Secondary | ICD-10-CM | POA: Insufficient documentation

## 2016-03-26 DIAGNOSIS — M5410 Radiculopathy, site unspecified: Secondary | ICD-10-CM | POA: Insufficient documentation

## 2016-03-26 DIAGNOSIS — Z79899 Other long term (current) drug therapy: Secondary | ICD-10-CM | POA: Insufficient documentation

## 2016-03-26 DIAGNOSIS — Z791 Long term (current) use of non-steroidal anti-inflammatories (NSAID): Secondary | ICD-10-CM | POA: Insufficient documentation

## 2016-03-26 DIAGNOSIS — I10 Essential (primary) hypertension: Secondary | ICD-10-CM | POA: Insufficient documentation

## 2016-03-26 DIAGNOSIS — Z7984 Long term (current) use of oral hypoglycemic drugs: Secondary | ICD-10-CM | POA: Insufficient documentation

## 2016-03-26 DIAGNOSIS — F17210 Nicotine dependence, cigarettes, uncomplicated: Secondary | ICD-10-CM | POA: Insufficient documentation

## 2016-03-26 MED ORDER — CARISOPRODOL 350 MG PO TABS: 350.0000 mg | ORAL_TABLET | Freq: Three times a day (TID) | ORAL | Status: DC | PRN

## 2016-03-26 MED ORDER — PREDNISONE 20 MG PO TABS
60.0000 mg | ORAL_TABLET | Freq: Once | ORAL | Status: AC
  Administered 2016-03-26: 60 mg via ORAL
  Filled 2016-03-26: qty 3

## 2016-03-26 MED ORDER — METHYLPREDNISOLONE 4 MG PO TABS: 4.0000 mg | ORAL_TABLET | Freq: Every day | ORAL | Status: DC

## 2016-03-26 NOTE — ED Provider Notes (Signed)
[facility] Emergency Department Provider Note   ____________________________________________  Time seen: Approximately 730 AM  I have reviewed the triage vital signs and the nursing notes.   HISTORY  Chief Complaint Shoulder Pain   HPI Corey Rasmussen is a 55 y.o. male with a history of a remote shoulder injury 2 years ago who is presenting with right shoulder pain radiating down his right arm. He says that this pain has been present for the past 4 weeks and he is unable to relieve it despite trying ibuprofen as well as Norco. He says it is aching and a "15 out of 10." He says that the pain radiates down from his right scapula all the way down the medial aspect of his right arm into his medial 3 fingers. He denies any weakness or numbness. Says that the pain decreases when he raises his arm above his head.No recent injury.   Past Medical History  Diagnosis Date  . Hypertension   . Diabetes mellitus without complication ([place])   . Hypercholesteremia     There are no active problems to display for this patient.   Past Surgical History  Procedure Laterality Date  . Knee surgery    . Foot surgery    . Eye surgery    . Hand surgery Right     Current Outpatient Rx  Name  Route  Sig  Dispense  Refill  . amLODipine (NORVASC) 5 MG tablet   Oral   Take 1 tablet (5 mg total) by mouth daily.   30 tablet   3   . cyclobenzaprine (FLEXERIL) 5 MG tablet   Oral   Take 1 tablet (5 mg total) by mouth 3 (three) times daily as needed for muscle spasms.   30 tablet   0   . hydrochlorothiazide (HYDRODIURIL) 12.5 MG tablet   Oral   Take 1 tablet (12.5 mg total) by mouth daily.   30 tablet   3   . hydrocortisone 1 % ointment   Topical   Apply 1 application topically 2 (two) times daily.   30 g   0   . ibuprofen (ADVIL,MOTRIN) 800 MG tablet   Oral   Take 1 tablet (800 mg total) by mouth 3 (three) times daily.   21 tablet   0   . metFORMIN  (GLUCOPHAGE) 500 MG tablet   Oral   Take 1,000 mg by mouth 2 (two) times daily with a meal.         . naproxen (NAPROSYN) 500 MG tablet   Oral   Take 1 tablet (500 mg total) by mouth 2 (two) times daily.   20 tablet   0     Allergies Review of patient's allergies indicates no known allergies.  History reviewed. No pertinent family history.  Social History Social History  Substance Use Topics  . Smoking status: Current Every Day Smoker -- 0.50 packs/day    Types: Cigarettes  . Smokeless tobacco: None  . Alcohol Use: Yes    Review of Systems Constitutional: No fever/chills Eyes: No visual changes. ENT: No sore throat. Cardiovascular: Denies chest pain. Respiratory: Denies shortness of breath. Gastrointestinal: No abdominal pain.  No nausea, no vomiting.  No diarrhea.  No constipation. Genitourinary: Negative for dysuria. Musculoskeletal: Negative for back pain. Skin: Negative for rash. Neurological: Negative for headaches, focal weakness or numbness.  10-point ROS otherwise negative.  ____________________________________________   PHYSICAL EXAM:  VITAL SIGNS: ED Triage Vitals  Enc Vitals Group  BP 03/26/16 0606 139/90 mmHg     Pulse Rate 03/26/16 0606 80     Resp 03/26/16 0606 18     Temp 03/26/16 0606 97.7 F (36.5 C)     Temp src --      SpO2 03/26/16 0606 100 %     Weight 03/26/16 0606 220 lb (99.791 kg)     Height 03/26/16 0606 5\' 11"  (1.803 m)     Head Cir --      Peak Flow --      Pain Score 03/26/16 0607 10     Pain Loc --      Pain Edu? --      Excl. in [place]? --     Constitutional: Alert and oriented. Well appearing and in no acute distress. Eyes: Conjunctivae are normal. PERRL. EOMI. Head: Atraumatic. Nose: No congestion/rhinnorhea. Mouth/Throat: Mucous membranes are moist.   Neck: No stridor.   Cardiovascular: Normal rate, regular rhythm. Grossly normal heart sounds.  Good peripheral circulation is intact bilateral radial  pulses. Respiratory: Normal respiratory effort.  No retractions. Lungs CTAB. Gastrointestinal: Soft and nontender. No distention.  Musculoskeletal: No lower extremity tenderness nor edema.  No joint effusions. No deformities or swelling. Neurologic:  Normal speech and language. No gross focal neurologic deficits are appreciated. No gait instability. 5 out of 5 strength throughout. No tenderness palpation to the right scapula. Skin:  Skin is warm, dry and intact. No rash noted. Psychiatric: Mood and affect are normal. Speech and behavior are normal.  ____________________________________________   LABS (all labs ordered are listed, but only abnormal results are displayed)  Labs Reviewed - No data to display ____________________________________________  EKG   ____________________________________________  RADIOLOGY   ____________________________________________   PROCEDURES   ____________________________________________   INITIAL IMPRESSION / ASSESSMENT AND PLAN / ED COURSE  Pertinent labs & imaging results that were available during my care of the patient were reviewed by me and considered in my medical decision making (see chart for details).  Patient says that he has had success in the past with muscle relaxers. We'll discharge with Soma. Discussed the potential side effects of drowsiness this medication and not to drive with this medication. He'll be taking the medication at night and when he is not working. We'll also discharge with a Medrol Dosepak. ____________________________________________   FINAL CLINICAL IMPRESSION(S) / ED DIAGNOSES  Radiculopathy.    NEW MEDICATIONS STARTED DURING THIS VISIT:  New Prescriptions   No medications on file     Note:  This document was prepared using Dragon voice recognition software and may include unintentional dictation errors.    Orbie Pyo, MD 03/26/16 [phone]

## 2016-03-26 NOTE — Discharge Instructions (Signed)
Radicular Pain °Radicular pain in either the arm or leg is usually from a bulging or herniated disk in the spine. A piece of the herniated disk may press against the nerves as the nerves exit the spine. This causes pain which is felt at the tips of the nerves down the arm or leg. Other causes of radicular pain may include: °· Fractures. °· Heart disease. °· Cancer. °· An abnormal and usually degenerative state of the nervous system or nerves (neuropathy). °Diagnosis may require CT or MRI scanning to determine the primary cause.  °Nerves that start at the neck (nerve roots) may cause radicular pain in the outer shoulder and arm. It can spread down to the thumb and fingers. The symptoms vary depending on which nerve root has been affected. In most cases radicular pain improves with conservative treatment. Neck problems may require physical therapy, a neck collar, or cervical traction. Treatment may take many weeks, and surgery may be considered if the symptoms do not improve.  °Conservative treatment is also recommended for sciatica. Sciatica causes pain to radiate from the lower back or buttock area down the leg into the foot. Often there is a history of back problems. Most patients with sciatica are better after 2 to 4 weeks of rest and other supportive care. Short term bed rest can reduce the disk pressure considerably. Sitting, however, is not a good position since this increases the pressure on the disk. You should avoid bending, lifting, and all other activities which make the problem worse. Traction can be used in severe cases. Surgery is usually reserved for patients who do not improve within the first months of treatment. °Only take over-the-counter or prescription medicines for pain, discomfort, or fever as directed by your caregiver. Narcotics and muscle relaxants may help by relieving more severe pain and spasm and by providing mild sedation. Cold or massage can give significant relief. Spinal manipulation  is not recommended. It can increase the degree of disc protrusion. Epidural steroid injections are often effective treatment for radicular pain. These injections deliver medicine to the spinal nerve in the space between the protective covering of the spinal cord and back bones (vertebrae). Your caregiver can give you more information about steroid injections. These injections are most effective when given within two weeks of the onset of pain.  °You should see your caregiver for follow up care as recommended. A program for neck and back injury rehabilitation with stretching and strengthening exercises is an important part of management.  °SEEK IMMEDIATE MEDICAL CARE IF: °· You develop increased pain, weakness, or numbness in your arm or leg. °· You develop difficulty with bladder or bowel control. °· You develop abdominal pain. °  °This information is not intended to replace advice given to you by your health care provider. Make sure you discuss any questions you have with your health care provider. °  °Document Released: 11/16/2004 Document Revised: 10/30/2014 Document Reviewed: 05/05/2015 °Elsevier Interactive Patient Education ©2016 [employer] ° °

## 2016-03-26 NOTE — ED Notes (Signed)
Pt. States having shoulder pain that started 4 weeks ago.  Pt. States "I think its a pinched nerve.  Pt. 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

## 2016-03-26 NOTE — ED Notes (Signed)
Pt was advised to take his prescribed medication on his day off to make sure that the medication does not make him drowsy.

## 2018-02-11 ENCOUNTER — Emergency Department
Admission: EM | Admit: 2018-02-11 | Discharge: 2018-02-12 | Disposition: A | Discharge status: Home / Self Care | Payer: Self-pay | Attending: Emergency Medicine | Admitting: Emergency Medicine

## 2018-02-11 ENCOUNTER — Encounter: Payer: Self-pay | Admitting: Emergency Medicine

## 2018-02-11 ENCOUNTER — Emergency Department: Payer: Self-pay

## 2018-02-11 ENCOUNTER — Other Ambulatory Visit: Payer: Self-pay

## 2018-02-11 DIAGNOSIS — Z5321 Procedure and treatment not carried out due to patient leaving prior to being seen by health care provider: Secondary | ICD-10-CM | POA: Insufficient documentation

## 2018-02-11 DIAGNOSIS — R079 Chest pain, unspecified: Secondary | ICD-10-CM | POA: Insufficient documentation

## 2018-02-11 LAB — CBC
HEMATOCRIT: 44.5 % (ref 40.0–52.0)
HEMOGLOBIN: 15.3 g/dL (ref 13.0–18.0)
MCH: 31.5 pg (ref 26.0–34.0)
MCHC: 34.3 g/dL (ref 32.0–36.0)
MCV: 91.8 fL (ref 80.0–100.0)
Platelets: 324 10*3/uL (ref 150–440)
RBC: 4.85 MIL/uL (ref 4.40–5.90)
RDW: 13.1 % (ref 11.5–14.5)
WBC: 8.4 10*3/uL (ref 3.8–10.6)

## 2018-02-11 LAB — BASIC METABOLIC PANEL
ANION GAP: 9 (ref 5–15)
BUN: 19 mg/dL (ref 6–20)
CALCIUM: 9.5 mg/dL (ref 8.9–10.3)
CHLORIDE: 104 mmol/L (ref 101–111)
CO2: 22 mmol/L (ref 22–32)
Creatinine, Ser: 1.2 mg/dL (ref 0.61–1.24)
GFR calc Af Amer: >60 mL/min (ref >60)
GFR calc non Af Amer: >60 mL/min (ref >60)
Glucose, Bld: 242 mg/dL — ABNORMAL HIGH (ref 65–99)
POTASSIUM: 4 mmol/L (ref 3.5–5.1)
Sodium: 135 mmol/L (ref 135–145)

## 2018-02-11 LAB — TROPONIN I

## 2018-02-11 IMAGING — CR DG CHEST 2V
1 series · 2 of 2 positions shown · non-contrast
Comparison: Chest x-ray dated [DATE].

CLINICAL DATA: Chest pain for the past week.

EXAM:
CHEST - 2 VIEW

[Series 1: w chest pa · 0.14mm/px · 2 of 2 slices shown]
[im 1/2]
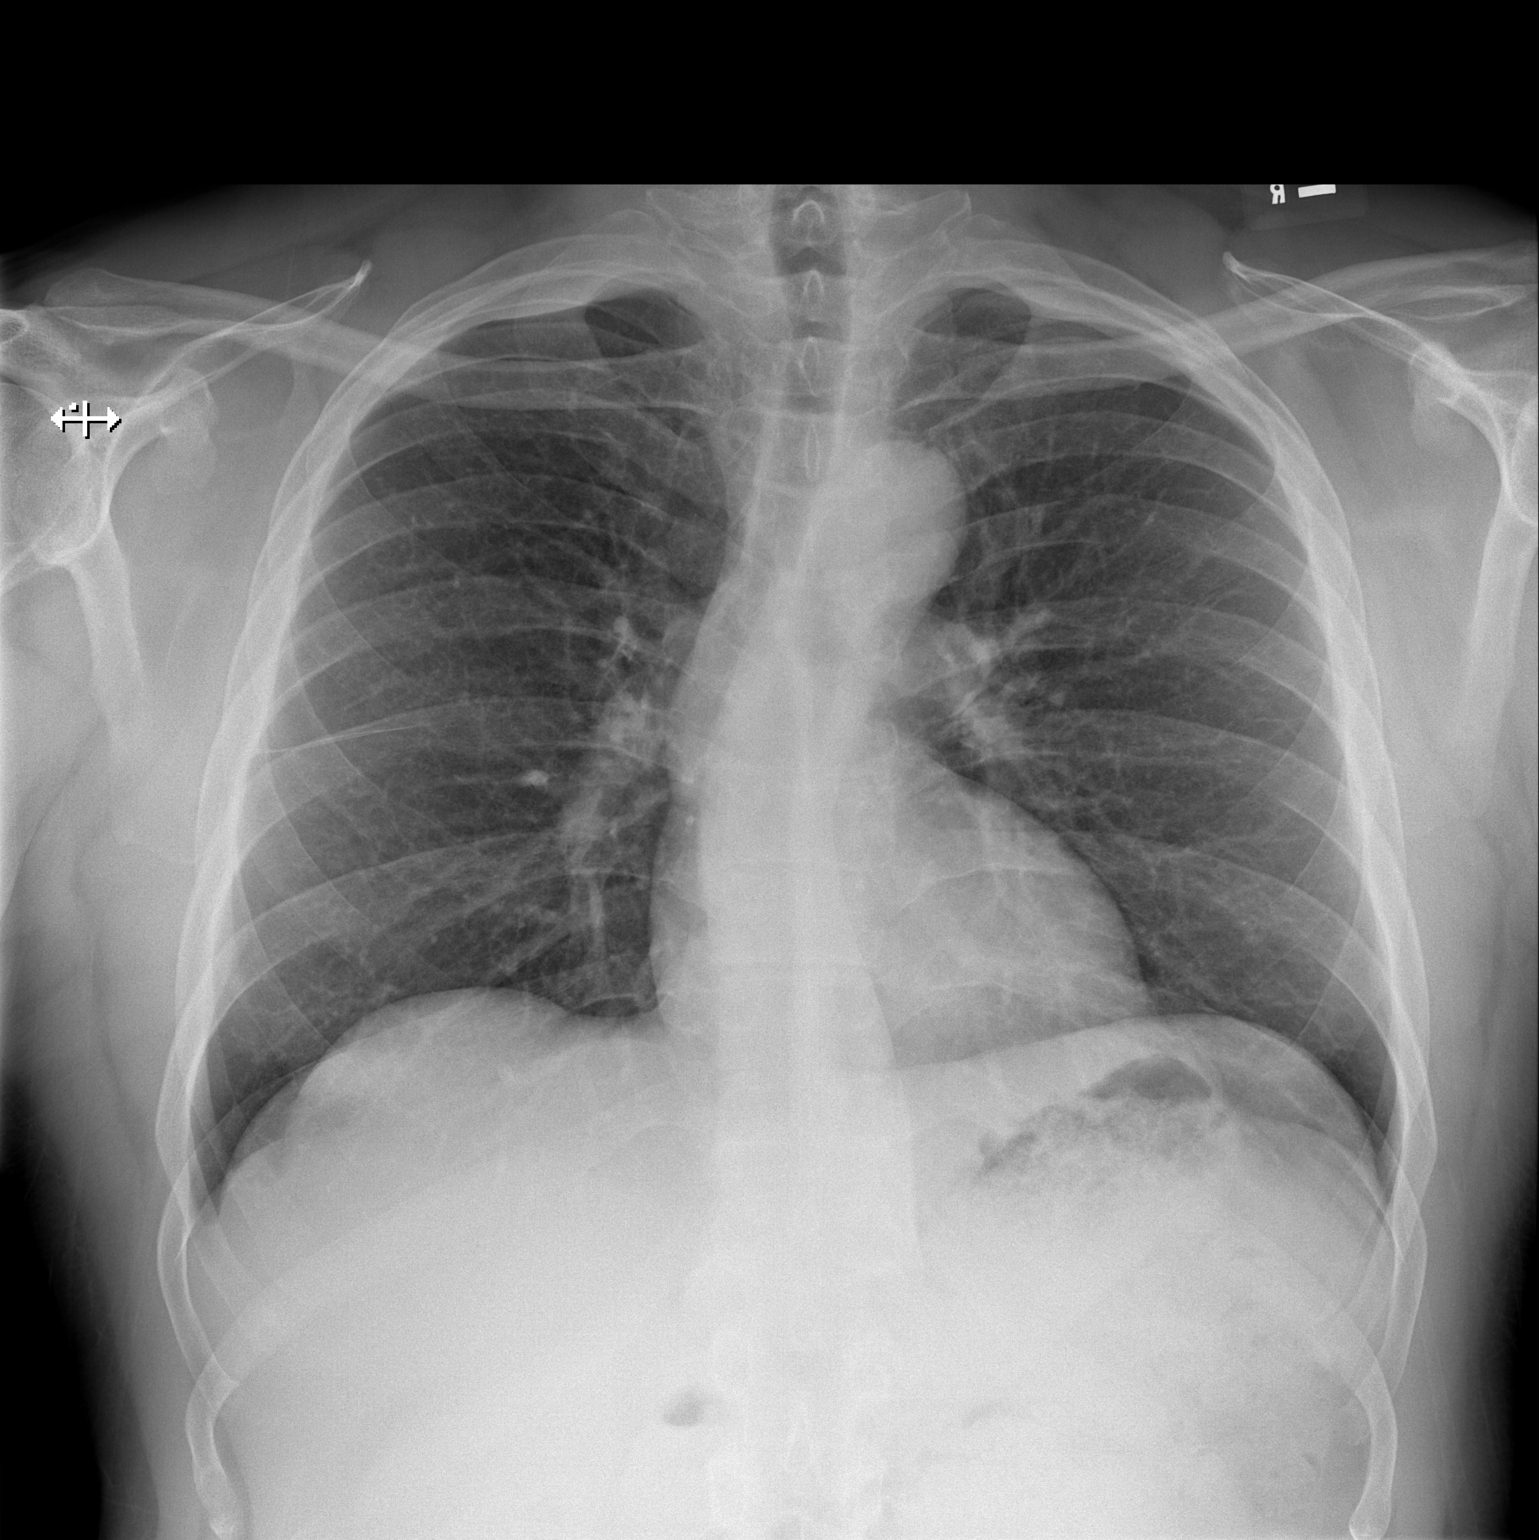
[im 2/2]
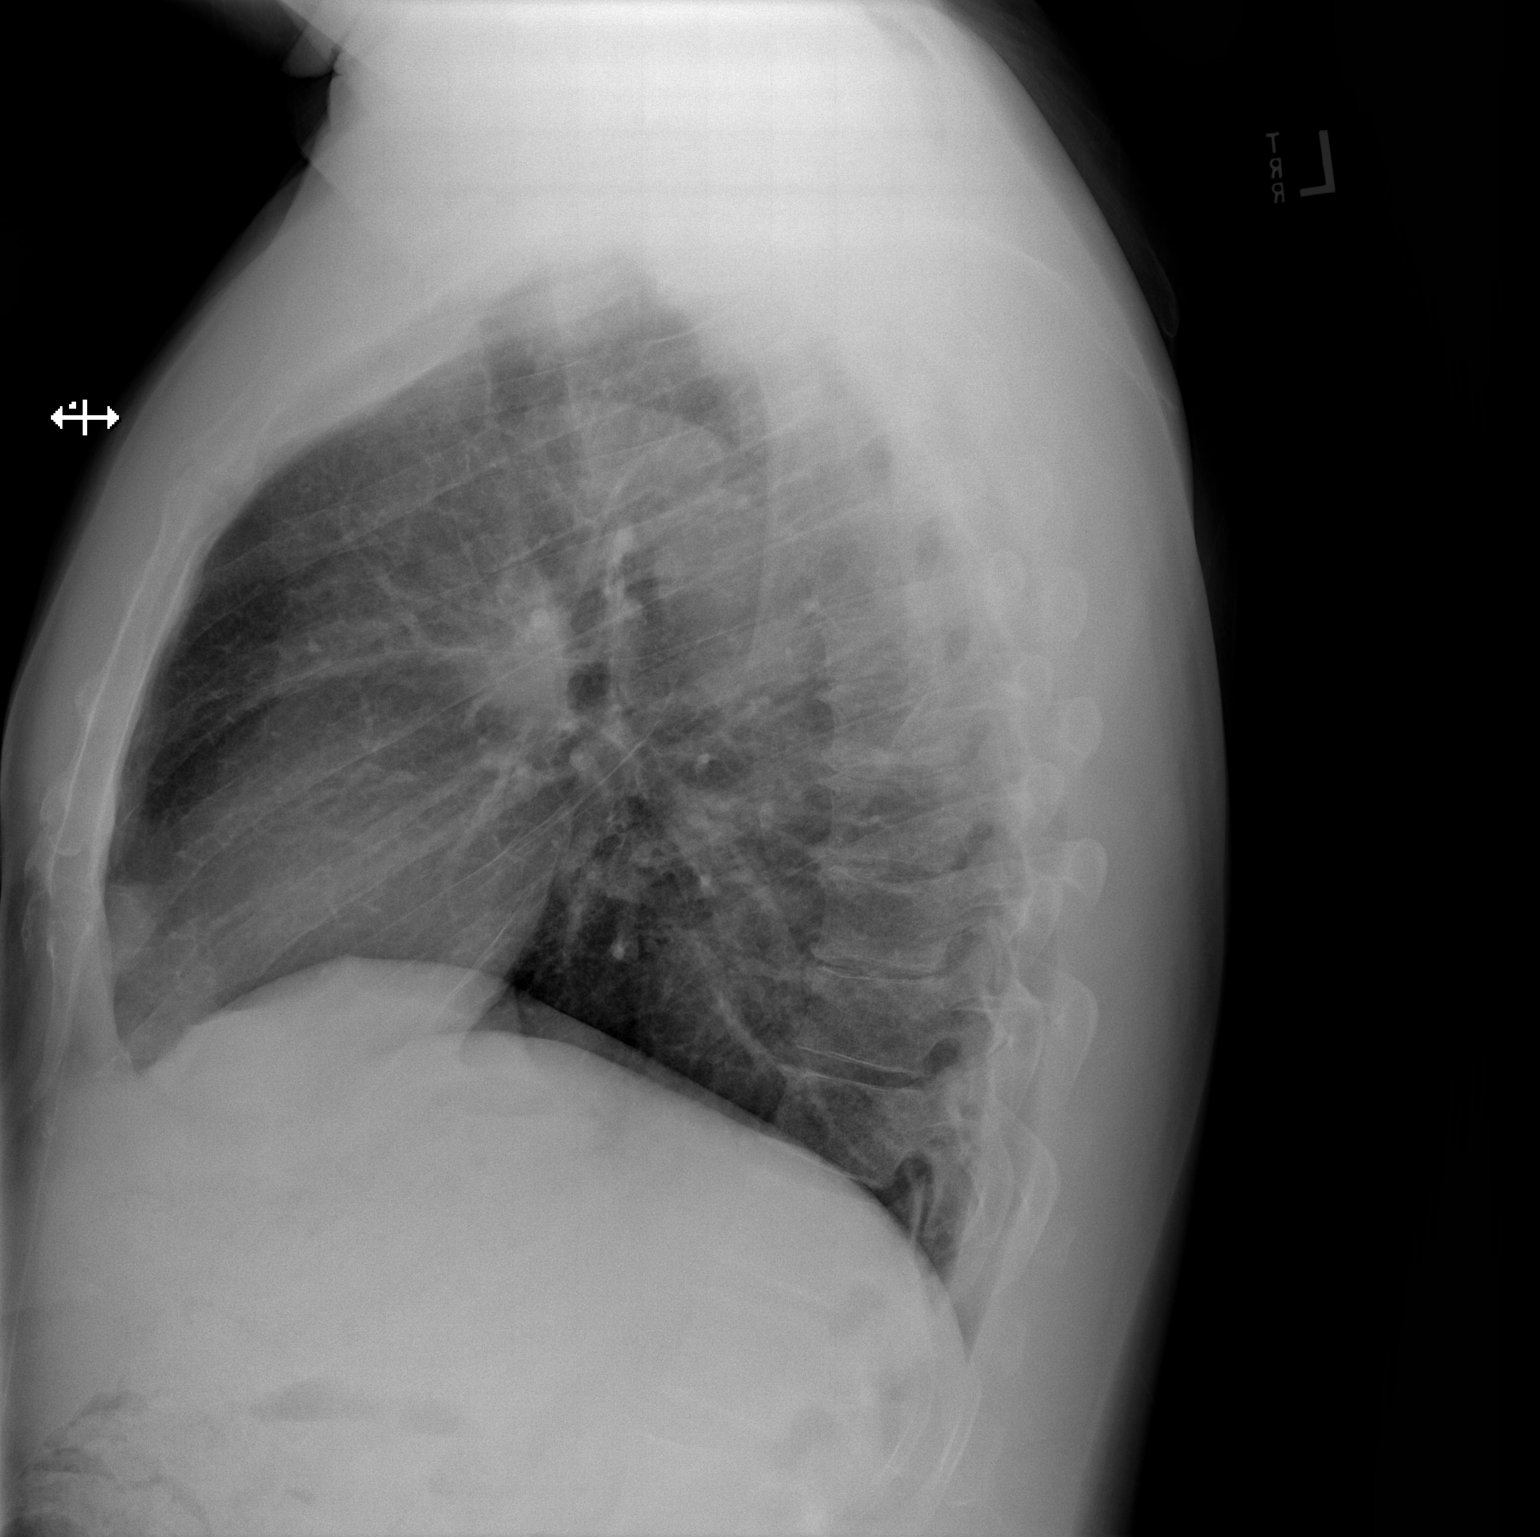

[2 of 2 positions shown; findings below may reference images not displayed]

FINDINGS: The heart size and mediastinal contours are within normal limits.
Normal pulmonary vascularity. No focal consolidation, pleural
effusion, or pneumothorax. No acute osseous abnormality.
IMPRESSION: No active cardiopulmonary disease.

## 2018-02-11 NOTE — ED Triage Notes (Signed)
Pt arrived to the ED for complaints of chest pain for 1 week. Pt reports that he has been experiencing chest pain for about a week now but decided to come to the ED today because he has something important to do tomorrow and wants to get checked. Pt is AOx4 in no apparent distress. No previous cardiac history.

## 2018-02-13 ENCOUNTER — Telehealth: Payer: Self-pay | Admitting: Emergency Medicine

## 2018-02-13 NOTE — Telephone Encounter (Addendum)
Called patient due to lwot to inquire about condition and follow up plans. Left message.  Patient called me back.  I explained labs and that they do not prove the chest pain was not his heart without seeing a doctor.  I advised him to call his pcp and let them know what happened and have them review test results.

## 2018-10-23 ENCOUNTER — Emergency Department: Payer: Self-pay

## 2018-10-23 ENCOUNTER — Encounter: Payer: Self-pay | Admitting: Emergency Medicine

## 2018-10-23 ENCOUNTER — Other Ambulatory Visit: Payer: Self-pay

## 2018-10-23 DIAGNOSIS — Z5321 Procedure and treatment not carried out due to patient leaving prior to being seen by health care provider: Secondary | ICD-10-CM | POA: Insufficient documentation

## 2018-10-23 DIAGNOSIS — R079 Chest pain, unspecified: Secondary | ICD-10-CM | POA: Insufficient documentation

## 2018-10-23 LAB — CBC WITH DIFFERENTIAL/PLATELET
ABS IMMATURE GRANULOCYTES: 0.05 10*3/uL (ref 0.00–0.07)
Basophils Absolute: 0.1 10*3/uL (ref 0.0–0.1)
Basophils Relative: 1 %
EOS PCT: 1 %
Eosinophils Absolute: 0.1 10*3/uL (ref 0.0–0.5)
HEMATOCRIT: 44.2 % (ref 39.0–52.0)
HEMOGLOBIN: 15 g/dL (ref 13.0–17.0)
Immature Granulocytes: 1 %
LYMPHS ABS: 2.7 10*3/uL (ref 0.7–4.0)
LYMPHS PCT: 29 %
MCH: 30.7 pg (ref 26.0–34.0)
MCHC: 33.9 g/dL (ref 30.0–36.0)
MCV: 90.4 fL (ref 80.0–100.0)
MONOS PCT: 9 %
Monocytes Absolute: 0.9 10*3/uL (ref 0.1–1.0)
NEUTROS ABS: 5.4 10*3/uL (ref 1.7–7.7)
Neutrophils Relative %: 59 %
Platelets: 324 10*3/uL (ref 150–400)
RBC: 4.89 MIL/uL (ref 4.22–5.81)
RDW: 11.9 % (ref 11.5–15.5)
WBC: 9.3 10*3/uL (ref 4.0–10.5)
nRBC: 0 % (ref 0.0–0.2)

## 2018-10-23 LAB — COMPREHENSIVE METABOLIC PANEL
ALK PHOS: 68 U/L (ref 38–126)
ALT: 10 U/L (ref 0–44)
AST: 16 U/L (ref 15–41)
Albumin: 4 g/dL (ref 3.5–5.0)
Anion gap: 8 (ref 5–15)
BILIRUBIN TOTAL: 0.3 mg/dL (ref 0.3–1.2)
BUN: 20 mg/dL (ref 6–20)
CALCIUM: 9.2 mg/dL (ref 8.9–10.3)
CO2: 21 mmol/L — AB (ref 22–32)
CREATININE: 1.18 mg/dL (ref 0.61–1.24)
Chloride: 103 mmol/L (ref 98–111)
GFR calc non Af Amer: >60 mL/min (ref >60)
GLUCOSE: 192 mg/dL — AB (ref 70–99)
Potassium: 3.9 mmol/L (ref 3.5–5.1)
SODIUM: 132 mmol/L — AB (ref 135–145)
TOTAL PROTEIN: 7.2 g/dL (ref 6.5–8.1)

## 2018-10-23 LAB — TROPONIN I: Troponin I: <0.03 ng/mL (ref <0.03)

## 2018-10-23 IMAGING — CR DG CHEST 2V
3 series · 3 of 3 positions shown · non-contrast
Comparison: [DATE]

CLINICAL DATA: Mid chest pain

EXAM:
CHEST - 2 VIEW

[chest pa (1 of 2)]
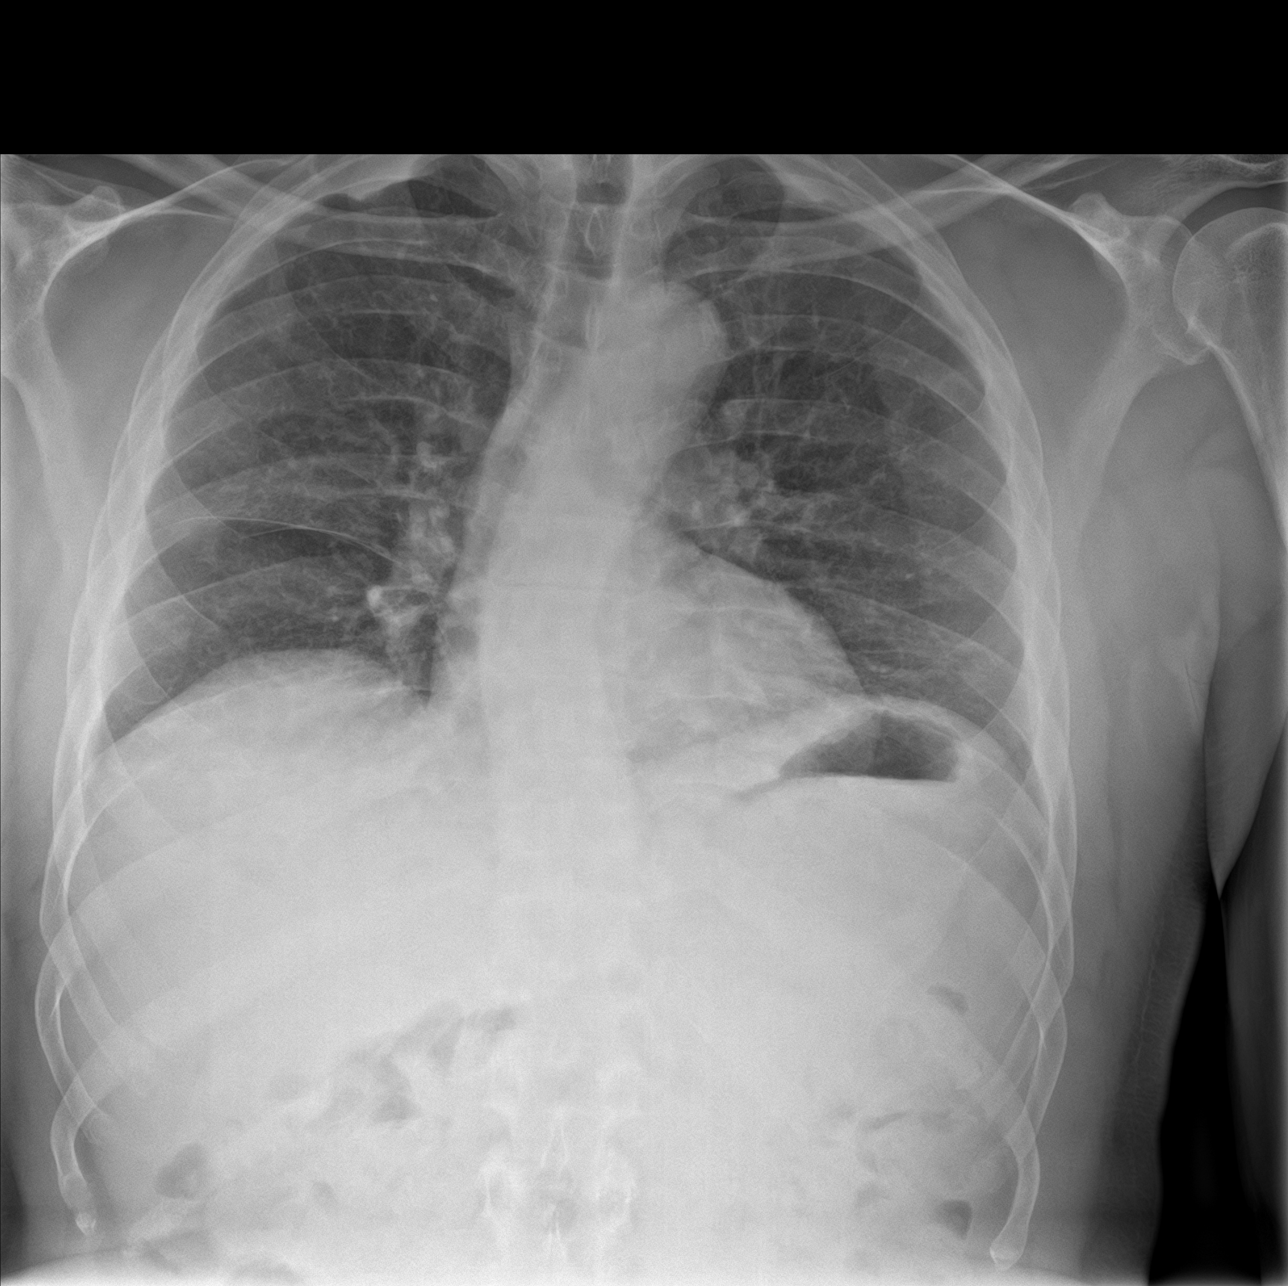

[chest lat]
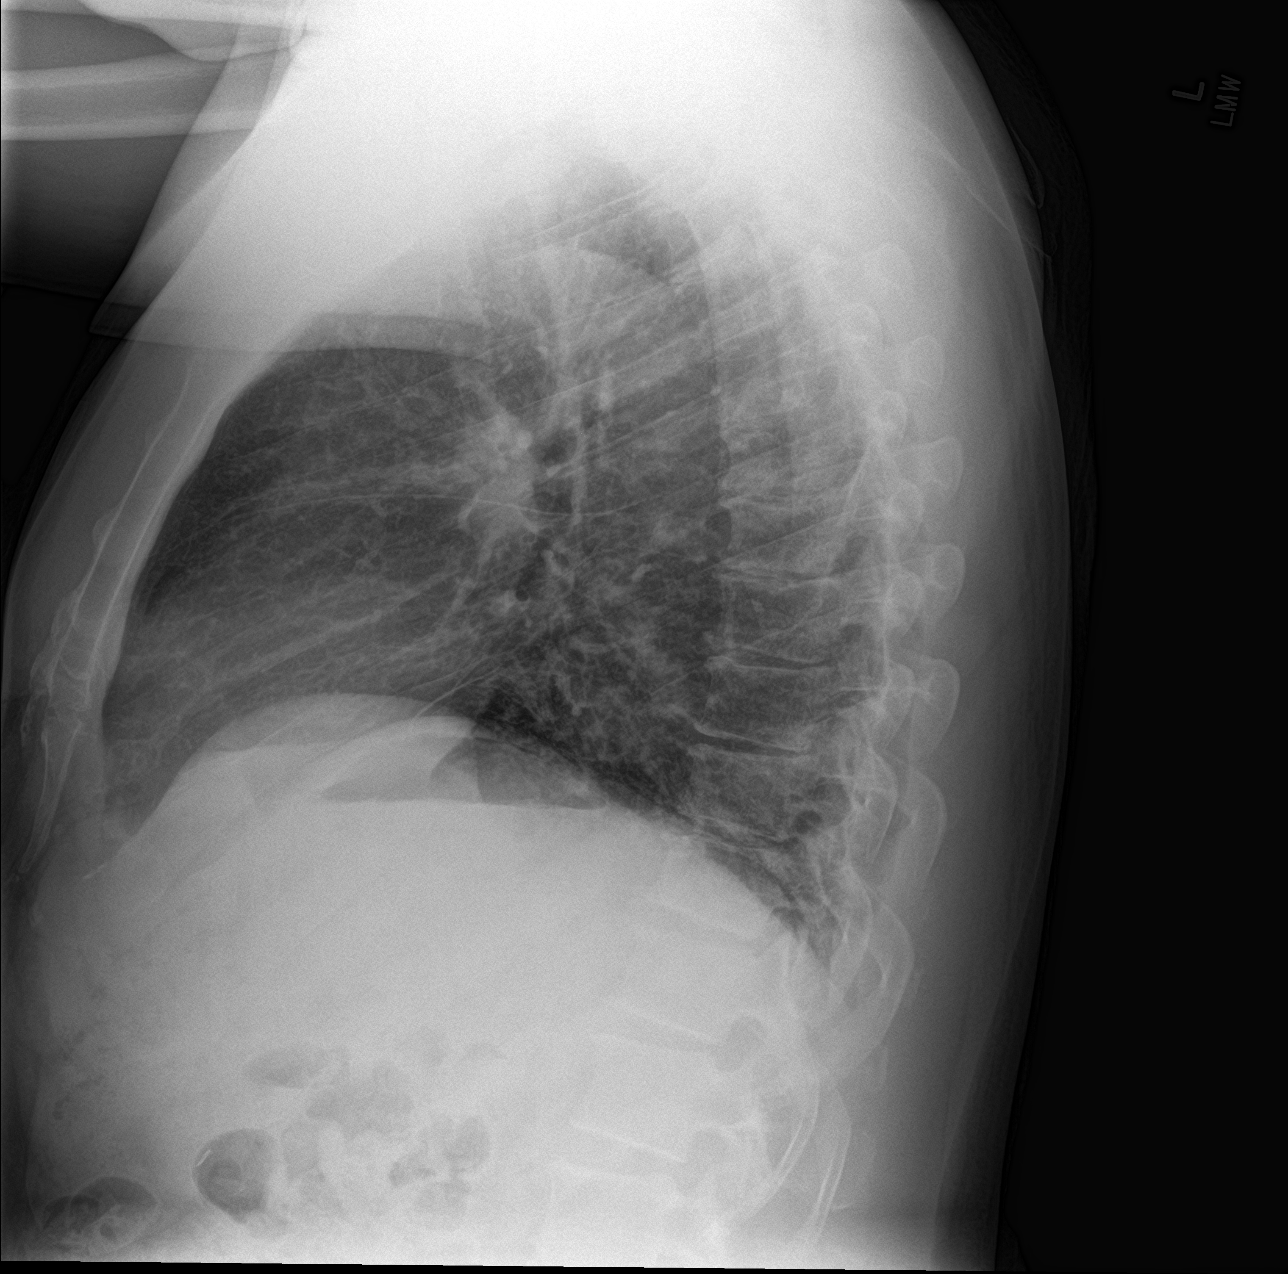

[chest pa (2 of 2)]
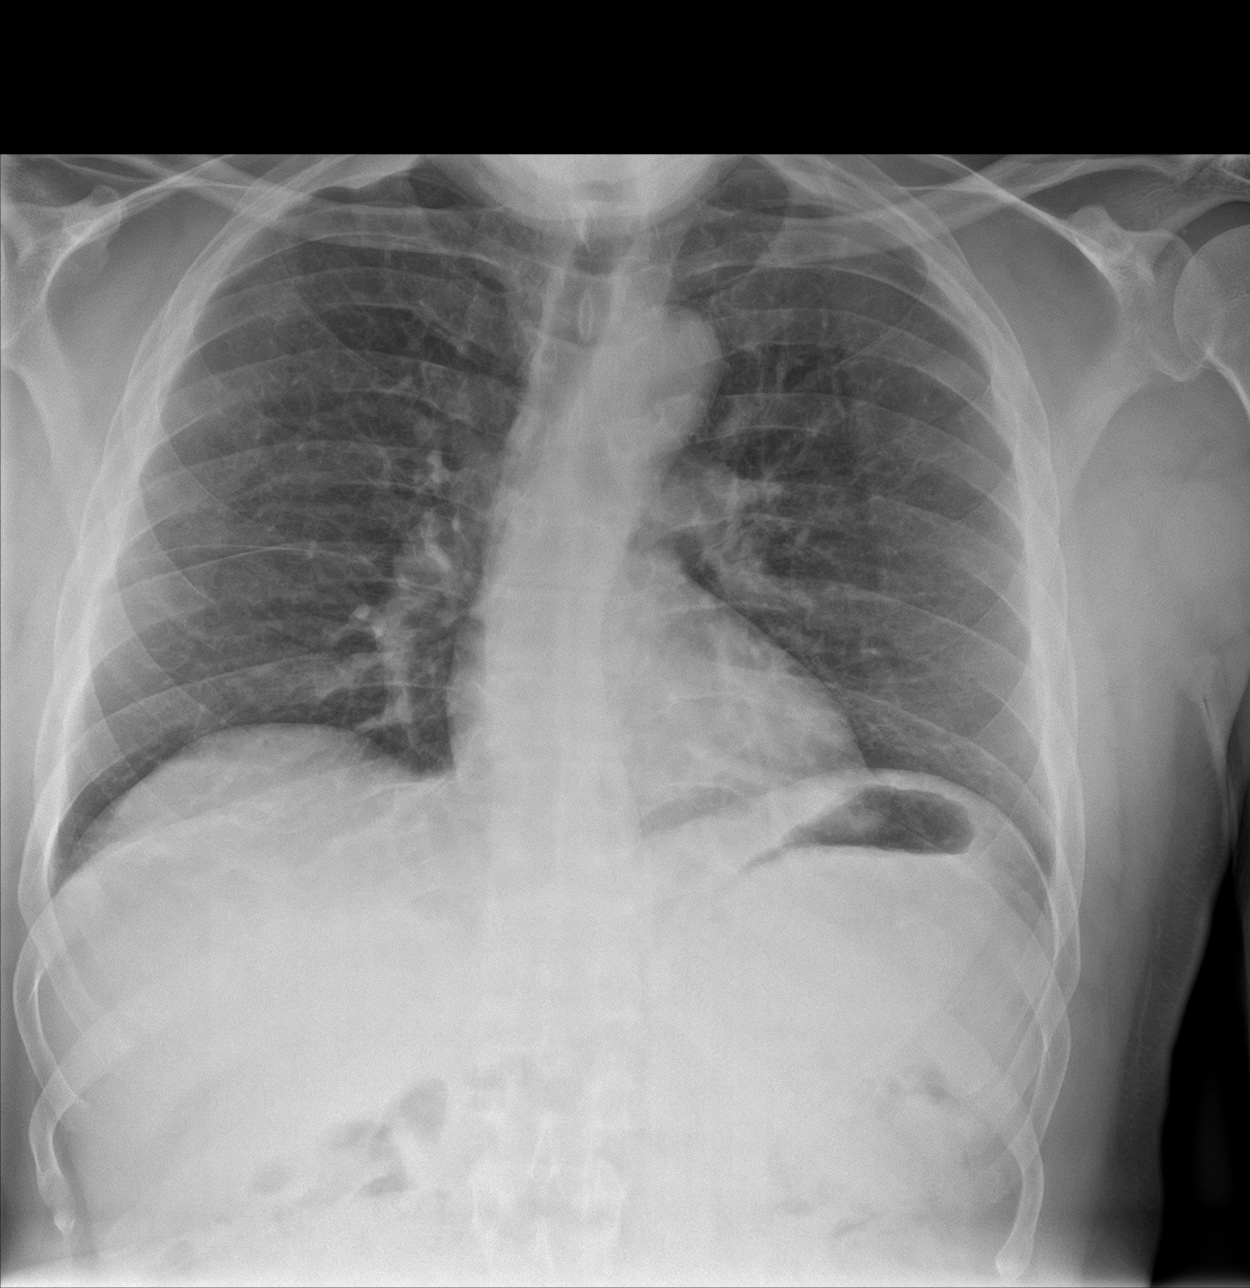

[3 of 3 positions shown; findings below may reference images not displayed]

FINDINGS: No consolidation or effusion. Streaky atelectasis at the left base.
Normal heart size. No pneumothorax. Scoliosis of the spine.
IMPRESSION: No active cardiopulmonary disease. Mild streaky atelectasis at the
left base.

## 2018-10-23 NOTE — ED Triage Notes (Addendum)
Pt to triage via w/c with no distress noted; Pt reports mid CP radiating thru to back accomp by "sweats" and SHOB; denies hx of same

## 2018-10-24 ENCOUNTER — Emergency Department
Admission: EM | Admit: 2018-10-24 | Discharge: 2018-10-24 | Discharge status: Against Medical Advice | Payer: Self-pay | Attending: Emergency Medicine | Admitting: Emergency Medicine

## 2019-03-31 ENCOUNTER — Other Ambulatory Visit: Payer: Self-pay

## 2019-03-31 ENCOUNTER — Emergency Department
Admission: EM | Admit: 2019-03-31 | Discharge: 2019-03-31 | Disposition: A | Discharge status: Home / Self Care | Payer: HRSA Program | Attending: Emergency Medicine | Admitting: Emergency Medicine

## 2019-03-31 ENCOUNTER — Encounter: Payer: Self-pay | Admitting: Emergency Medicine

## 2019-03-31 ENCOUNTER — Emergency Department: Payer: HRSA Program

## 2019-03-31 DIAGNOSIS — Z79899 Other long term (current) drug therapy: Secondary | ICD-10-CM | POA: Insufficient documentation

## 2019-03-31 DIAGNOSIS — Z7984 Long term (current) use of oral hypoglycemic drugs: Secondary | ICD-10-CM | POA: Insufficient documentation

## 2019-03-31 DIAGNOSIS — U071 COVID-19: Secondary | ICD-10-CM | POA: Diagnosis not present

## 2019-03-31 DIAGNOSIS — J988 Other specified respiratory disorders: Secondary | ICD-10-CM | POA: Insufficient documentation

## 2019-03-31 DIAGNOSIS — F17210 Nicotine dependence, cigarettes, uncomplicated: Secondary | ICD-10-CM | POA: Diagnosis not present

## 2019-03-31 DIAGNOSIS — I10 Essential (primary) hypertension: Secondary | ICD-10-CM | POA: Insufficient documentation

## 2019-03-31 DIAGNOSIS — E119 Type 2 diabetes mellitus without complications: Secondary | ICD-10-CM | POA: Diagnosis not present

## 2019-03-31 DIAGNOSIS — M791 Myalgia, unspecified site: Secondary | ICD-10-CM | POA: Diagnosis present

## 2019-03-31 LAB — SARS CORONAVIRUS 2 BY RT PCR (HOSPITAL ORDER, PERFORMED IN ~~LOC~~ HOSPITAL LAB): SARS Coronavirus 2: POSITIVE — AB

## 2019-03-31 IMAGING — CR CHEST - 2 VIEW
1 series · 2 of 2 positions shown · non-contrast
Comparison: [DATE]

CLINICAL DATA: Chills and body aches with cough over the last 3
weeks.

EXAM:
CHEST - 2 VIEW

[Series 1: dg chest 2 view · 0.14mm/px · 2 of 2 slices shown]
[im 1/2]
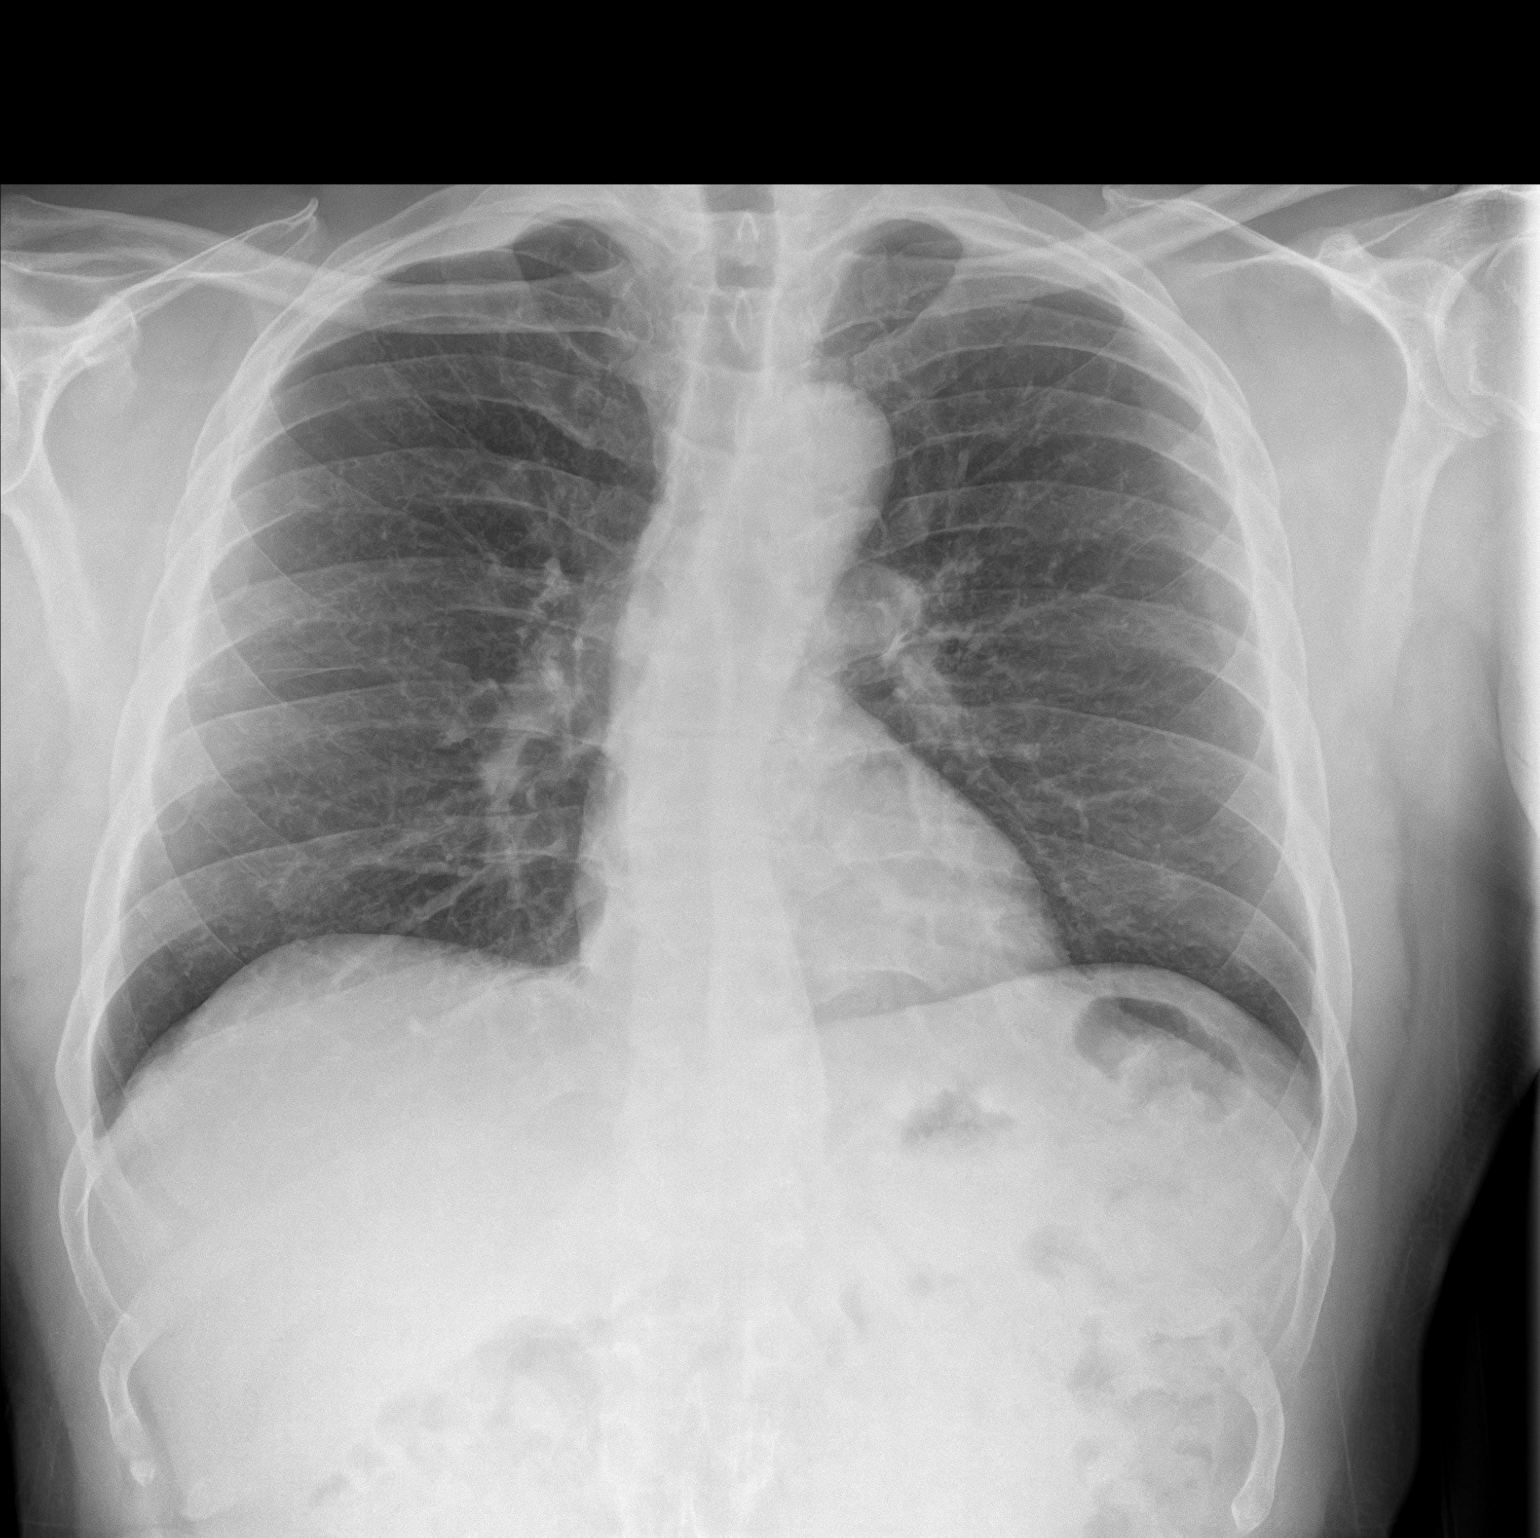
[im 2/2]
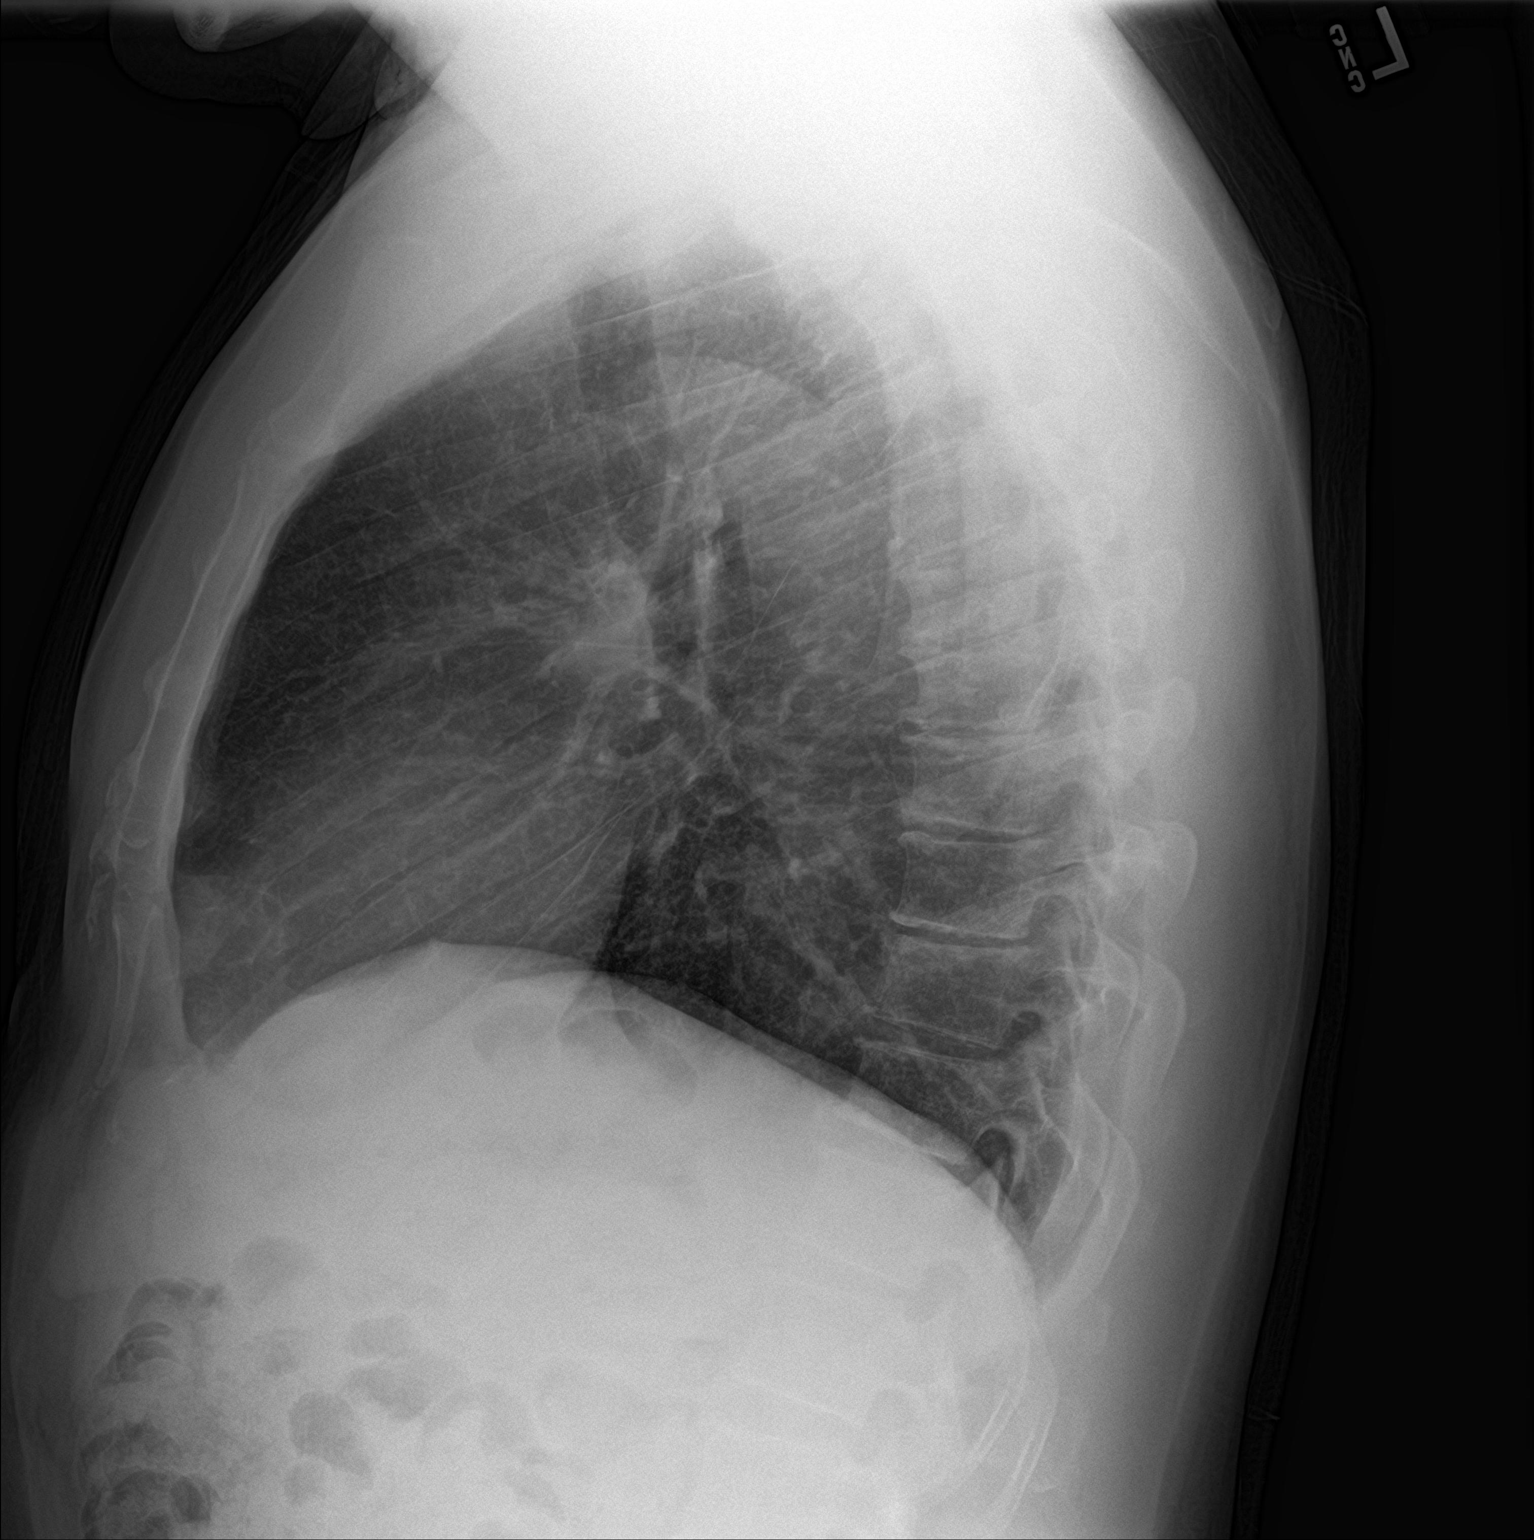

[2 of 2 positions shown; findings below may reference images not displayed]

FINDINGS: Heart size is normal. Mediastinal shadows are normal. The lungs are
clear. No effusions. Mild spinal curvature in degenerative change as
seen previously.
IMPRESSION: No active cardiopulmonary disease.

## 2019-03-31 MED ORDER — ACETAMINOPHEN 500 MG PO TABS
1000.0000 mg | ORAL_TABLET | Freq: Once | ORAL | Status: AC
  Administered 2019-03-31: 21:00:00 1000 mg via ORAL
  Filled 2019-03-31: qty 2

## 2019-03-31 NOTE — ED Provider Notes (Signed)
[facility] Emergency Department Provider Note  Time seen: 8:29 PM  I have reviewed the triage vital signs and the nursing notes.   HISTORY  Chief Complaint Generalized Body Aches; Chills; and Back Pain   HPI Corey Rasmussen is a 58 y.o. male with a past medical history of diabetes, hypertension, hyperlipidemia presents to the emergency department for body aches and congestion.  According to the patient yesterday he developed congestion in his throat somewhat in his chest.  Denies any cough or shortness of breath.  Developed chills last night along with body aches.  Found to have low-grade fever in the emergency department 100.1.  Patient denies any obvious known sick contacts however he does state he recently hung out with a friend who had recovered from COVID-19, but states he kept his distance.  Largely negative review of systems otherwise.  Overall very well-appearing.  Past Medical History:  Diagnosis Date  . Diabetes mellitus without complication ([place])   . Hypercholesteremia   . Hypertension     There are no active problems to display for this patient.   Past Surgical History:  Procedure Laterality Date  . EYE SURGERY    . FOOT SURGERY    . HAND SURGERY Right   . KNEE SURGERY      Prior to Admission medications   Medication Sig Start Date End Date Taking? Authorizing Provider  amLODipine (NORVASC) 5 MG tablet Take 1 tablet (5 mg total) by mouth daily. 01/19/14   Harden Mo, MD  carisoprodol (SOMA) 350 MG tablet Take 1 tablet (350 mg total) by mouth 3 (three) times daily as needed for muscle spasms. 03/26/16   Schaevitz, Randall An, MD  cyclobenzaprine (FLEXERIL) 5 MG tablet Take 1 tablet (5 mg total) by mouth 3 (three) times daily as needed for muscle spasms. 01/08/14   Carvel Getting, NP  hydrochlorothiazide (HYDRODIURIL) 12.5 MG tablet Take 1 tablet (12.5 mg total) by mouth daily. 01/19/14   Harden Mo, MD  hydrocortisone 1 % ointment  Apply 1 application topically 2 (two) times daily. 05/01/15   Paulette Blanch, MD  ibuprofen (ADVIL,MOTRIN) 800 MG tablet Take 1 tablet (800 mg total) by mouth 3 (three) times daily. 10/04/13   Teressa Lower, MD  metFORMIN (GLUCOPHAGE) 500 MG tablet Take 1,000 mg by mouth 2 (two) times daily with a meal.    [provider]  methylPREDNISolone (MEDROL) 4 MG tablet Take 1 tablet (4 mg total) by mouth daily. 03/26/16   Schaevitz, Randall An, MD  naproxen (NAPROSYN) 500 MG tablet Take 1 tablet (500 mg total) by mouth 2 (two) times daily. 01/08/14   Carvel Getting, NP    No Known Allergies  No family history on file.  Social History Social History   Tobacco Use  . Smoking status: Current Every Day Smoker    Packs/day: 0.50    Types: Cigarettes  . Smokeless tobacco: Never Used  Substance Use Topics  . Alcohol use: Never    Frequency: Never  . Drug use: No    Review of Systems Constitutional: Positive for low-grade fever this morning, chills since last night. ENT: Mild congestion Cardiovascular: Negative for chest pain. Respiratory: Negative for shortness of breath.  Negative for cough. Gastrointestinal: Negative for abdominal pain, vomiting and diarrhea. Musculoskeletal: Generalized body aches. Skin: Negative for skin complaints  Neurological: Negative for headache All other ROS negative  ____________________________________________   PHYSICAL EXAM:  VITAL SIGNS: ED Triage Vitals  Enc Vitals  Group     BP 03/31/19 1830 126/82     Pulse Rate 03/31/19 1830 86     Resp 03/31/19 1830 20     Temp 03/31/19 1830 100.1 F (37.8 C)     Temp Source 03/31/19 1830 Oral     SpO2 03/31/19 1830 98 %     Weight 03/31/19 1831 222 lb (100.7 kg)     Height 03/31/19 1831 5\' 11"  (1.803 m)     Head Circumference --      Peak Flow --      Pain Score 03/31/19 1830 6     Pain Loc --      Pain Edu? --      Excl. in [place]? --    Constitutional: Alert and oriented. Well appearing and in no  distress. Eyes: Normal exam ENT      Head: Normocephalic and atraumatic.      Mouth/Throat: Mucous membranes are moist. Cardiovascular: Normal rate, regular rhythm.  Respiratory: Normal respiratory effort without tachypnea nor retractions. Breath sounds are clear  Gastrointestinal: Soft and nontender. No distention.   Musculoskeletal: Nontender with normal range of motion in all extremities.  Neurologic:  Normal speech and language. No gross focal neurologic deficits Skin:  Skin is warm, dry and intact.  Psychiatric: Mood and affect are normal.   ____________________________________________     RADIOLOGY  Chest x-ray negative  ____________________________________________   INITIAL IMPRESSION / ASSESSMENT AND PLAN / ED COURSE  Pertinent labs & imaging results that were available during my care of the patient were reviewed by me and considered in my medical decision making (see chart for details).   Patient presents to the emergency department for congestion, low-grade fever and body aches starting yesterday.  Differential would include upper respiratory infection, viral infection, COVID-19, pneumonia.  Overall the patient appears well, chest x-ray is negative.  Vitals are reassuring besides low-grade temperature.  We will dose Tylenol.  We will check a COVID-19 swab.  Patient agreeable to plan of care.  Patient has tested positive for coronavirus.  I discussed supportive care at home, Tylenol and isolation.  Reassuringly patient has a normal pulse ox, otherwise reassuring work-up.  Patient will be discharged home.  Corey Rasmussen was evaluated in Emergency Department on 03/31/2019 for the symptoms described in the history of present illness. He was evaluated in the context of the global COVID-19 pandemic, which necessitated consideration that the patient might be at risk for infection with the SARS-CoV-2 virus that causes COVID-19. Institutional protocols and algorithms that pertain to  the evaluation of patients at risk for COVID-19 are in a state of rapid change based on information released by regulatory bodies including the CDC and federal and state organizations. These policies and algorithms were followed during the patient's care in the ED.  ____________________________________________   FINAL CLINICAL IMPRESSION(S) / ED DIAGNOSES  COVID-19   Harvest Dark, MD 03/31/19 2159

## 2019-03-31 NOTE — Discharge Instructions (Addendum)
Person Under Monitoring Name: Corey Rasmussen  Location: [place] [address]   Infection Prevention Recommendations for Individuals Confirmed to have, or Being Evaluated for, 2019 Novel Coronavirus (COVID-19) Infection Who Receive Care at Home  Individuals who are confirmed to have, or are being evaluated for, COVID-19 should follow the prevention steps below until a healthcare provider or local or state health department says they can return to normal activities.  Stay home except to get medical care You should restrict activities outside your home, except for getting medical care. Do not go to work, school, or public areas, and do not use public transportation or taxis.  Call ahead before visiting your doctor Before your medical appointment, call the healthcare provider and tell them that you have, or are being evaluated for, COVID-19 infection. This will help the healthcare providers office take steps to keep other people from getting infected. Ask your healthcare provider to call the local or state health department.  Monitor your symptoms Seek prompt medical attention if your illness is worsening (e.g., difficulty breathing). Before going to your medical appointment, call the healthcare provider and tell them that you have, or are being evaluated for, COVID-19 infection. Ask your healthcare provider to call the local or state health department.  Wear a facemask You should wear a facemask that covers your nose and mouth when you are in the same room with other people and when you visit a healthcare provider. People who live with or visit you should also wear a facemask while they are in the same room with you.  Separate yourself from other people in your home As much as possible, you should stay in a different room from other people in your home. Also, you should use a separate bathroom, if available.  Avoid sharing household items You should not  share dishes, drinking glasses, cups, eating utensils, towels, bedding, or other items with other people in your home. After using these items, you should wash them thoroughly with soap and water.  Cover your coughs and sneezes Cover your mouth and nose with a tissue when you cough or sneeze, or you can cough or sneeze into your sleeve. Throw used tissues in a lined trash can, and immediately wash your hands with soap and water for at least 20 seconds or use an alcohol-based hand rub.  Wash your [facility] your hands often and thoroughly with soap and water for at least 20 seconds. You can use an alcohol-based hand sanitizer if soap and water are not available and if your hands are not visibly dirty. Avoid touching your eyes, nose, and mouth with unwashed hands.   Prevention Steps for Caregivers and Household Members of Individuals Confirmed to have, or Being Evaluated for, COVID-19 Infection Being Cared for in the Home  If you live with, or provide care at home for, a person confirmed to have, or being evaluated for, COVID-19 infection please follow these guidelines to prevent infection:  Follow healthcare providers instructions Make sure that you understand and can help the patient follow any healthcare provider instructions for all care.  Provide for the patients basic needs You should help the patient with basic needs in the home and provide support for getting groceries, prescriptions, and other personal needs.  Monitor the patients symptoms If they are getting sicker, call his or her medical provider and tell them that the patient has, or is being evaluated for, COVID-19 infection. This will help the healthcare providers office  take steps to keep other people from getting infected. Ask the healthcare provider to call the local or state health department.  Limit the number of people who have contact with the patient If possible, have only one caregiver for the  patient. Other household members should stay in another home or place of residence. If this is not possible, they should stay in another room, or be separated from the patient as much as possible. Use a separate bathroom, if available. Restrict visitors who do not have an essential need to be in the home.  Keep older adults, very young children, and other sick people away from the patient Keep older adults, very young children, and those who have compromised immune systems or chronic health conditions away from the patient. This includes people with chronic heart, lung, or kidney conditions, diabetes, and cancer.  Ensure good ventilation Make sure that shared spaces in the home have good air flow, such as from an air conditioner or an opened window, weather permitting.  Wash your hands often Wash your hands often and thoroughly with soap and water for at least 20 seconds. You can use an alcohol based hand sanitizer if soap and water are not available and if your hands are not visibly dirty. Avoid touching your eyes, nose, and mouth with unwashed hands. Use disposable paper towels to dry your hands. If not available, use dedicated cloth towels and replace them when they become wet.  Wear a facemask and gloves Wear a disposable facemask at all times in the room and gloves when you touch or have contact with the patients blood, body fluids, and/or secretions or excretions, such as sweat, saliva, sputum, nasal mucus, vomit, urine, or feces.  Ensure the mask fits over your nose and mouth tightly, and do not touch it during use. Throw out disposable facemasks and gloves after using them. Do not reuse. Wash your hands immediately after removing your facemask and gloves. If your personal clothing becomes contaminated, carefully remove clothing and launder. Wash your hands after handling contaminated clothing. Place all used disposable facemasks, gloves, and other waste in a lined container before  disposing them with other household waste. Remove gloves and wash your hands immediately after handling these items.  Do not share dishes, glasses, or other household items with the patient Avoid sharing household items. You should not share dishes, drinking glasses, cups, eating utensils, towels, bedding, or other items with a patient who is confirmed to have, or being evaluated for, COVID-19 infection. After the person uses these items, you should wash them thoroughly with soap and water.  Wash laundry thoroughly Immediately remove and wash clothes or bedding that have blood, body fluids, and/or secretions or excretions, such as sweat, saliva, sputum, nasal mucus, vomit, urine, or feces, on them. Wear gloves when handling laundry from the patient. Read and follow directions on labels of laundry or clothing items and detergent. In general, wash and dry with the warmest temperatures recommended on the label.  Clean all areas the individual has used often Clean all touchable surfaces, such as counters, tabletops, doorknobs, bathroom fixtures, toilets, phones, keyboards, tablets, and bedside tables, every day. Also, clean any surfaces that may have blood, body fluids, and/or secretions or excretions on them. Wear gloves when cleaning surfaces the patient has come in contact with. Use a diluted bleach solution (e.g., dilute bleach with 1 part bleach and 10 parts water) or a household disinfectant with a label that says EPA-registered for coronaviruses. To make a bleach  solution at home, add 1 tablespoon of bleach to 1 quart (4 cups) of water. For a larger supply, add  cup of bleach to 1 gallon (16 cups) of water. Read labels of cleaning products and follow recommendations provided on product labels. Labels contain instructions for safe and effective use of the cleaning product including precautions you should take when applying the product, such as wearing gloves or eye protection and making sure you  have good ventilation during use of the product. Remove gloves and wash hands immediately after cleaning.  Monitor yourself for signs and symptoms of illness Caregivers and household members are considered close contacts, should monitor their health, and will be asked to limit movement outside of the home to the extent possible. Follow the monitoring steps for close contacts listed on the symptom monitoring form.   ? If you have additional questions, contact your local health department or call the epidemiologist on call at [phone] (available 24/7). ? This guidance is subject to change. For the most up-to-date guidance from [facility], please refer to their website: [email or web address]

## 2019-03-31 NOTE — ED Triage Notes (Signed)
Body aches, chills, back pain with cough 3 weeks.

## 2019-06-07 ENCOUNTER — Other Ambulatory Visit: Payer: Self-pay | Admitting: Radiology

## 2019-06-07 DIAGNOSIS — Z20822 Contact with and (suspected) exposure to covid-19: Secondary | ICD-10-CM

## 2019-06-08 LAB — NOVEL CORONAVIRUS, NAA: SARS-CoV-2, NAA: NOT DETECTED

## 2020-01-10 ENCOUNTER — Ambulatory Visit: Payer: Self-pay | Attending: Internal Medicine

## 2020-01-10 DIAGNOSIS — Z23 Encounter for immunization: Secondary | ICD-10-CM

## 2020-01-10 NOTE — Progress Notes (Signed)
   Covid-19 Vaccination Clinic  Name:  YECHESKEL PERNICK    MRN: [id number] DOB: 03-24-1961  01/10/2020  Mr. Lemberger was observed post Covid-19 immunization for 15 minutes without incident. He was provided with Vaccine Information Sheet and instruction to access the V-Safe system.   Mr. Matheny was instructed to call 911 with any severe reactions post vaccine: Marland Kitchen Difficulty breathing  . Swelling of face and throat  . A fast heartbeat  . A bad rash all over body  . Dizziness and weakness   Immunizations Administered    Name Date Dose VIS Date Route   Pfizer COVID-19 Vaccine 01/10/2020  8:27 AM 0.3 mL 10/03/2019 Intramuscular   Manufacturer: [place]   Lot: [id number]   [place]: [id number]

## 2020-01-31 ENCOUNTER — Ambulatory Visit: Payer: Self-pay

## 2020-02-01 ENCOUNTER — Ambulatory Visit: Payer: Self-pay | Attending: Internal Medicine

## 2020-02-01 DIAGNOSIS — Z23 Encounter for immunization: Secondary | ICD-10-CM

## 2020-02-01 NOTE — Progress Notes (Signed)
   Covid-19 Vaccination Clinic  Name:  Corey Rasmussen    MRN: [id number] DOB: Dec 29, 1960  02/01/2020  Corey Rasmussen was observed post Covid-19 immunization for 15 minutes without incident. He was provided with Vaccine Information Sheet and instruction to access the V-Safe system.   Corey Rasmussen was instructed to call 911 with any severe reactions post vaccine: Marland Kitchen Difficulty breathing  . Swelling of face and throat  . A fast heartbeat  . A bad rash all over body  . Dizziness and weakness   Immunizations Administered    Name Date Dose VIS Date Route   Pfizer COVID-19 Vaccine 02/01/2020 10:40 AM 0.3 mL 10/03/2019 Intramuscular   Manufacturer: [place]   Lot: [id number]   [place]: [id number]

## 2020-03-07 ENCOUNTER — Other Ambulatory Visit: Payer: Self-pay

## 2020-03-07 ENCOUNTER — Encounter: Payer: Self-pay | Admitting: Emergency Medicine

## 2020-03-07 ENCOUNTER — Emergency Department: Payer: Self-pay

## 2020-03-07 ENCOUNTER — Emergency Department
Admission: EM | Admit: 2020-03-07 | Discharge: 2020-03-07 | Disposition: A | Discharge status: Home / Self Care | Payer: Self-pay | Attending: Emergency Medicine | Admitting: Emergency Medicine

## 2020-03-07 DIAGNOSIS — M25511 Pain in right shoulder: Secondary | ICD-10-CM | POA: Insufficient documentation

## 2020-03-07 DIAGNOSIS — Z79899 Other long term (current) drug therapy: Secondary | ICD-10-CM | POA: Insufficient documentation

## 2020-03-07 DIAGNOSIS — I10 Essential (primary) hypertension: Secondary | ICD-10-CM | POA: Insufficient documentation

## 2020-03-07 DIAGNOSIS — Z7984 Long term (current) use of oral hypoglycemic drugs: Secondary | ICD-10-CM | POA: Insufficient documentation

## 2020-03-07 DIAGNOSIS — M549 Dorsalgia, unspecified: Secondary | ICD-10-CM | POA: Insufficient documentation

## 2020-03-07 DIAGNOSIS — R42 Dizziness and giddiness: Secondary | ICD-10-CM | POA: Insufficient documentation

## 2020-03-07 DIAGNOSIS — R55 Syncope and collapse: Secondary | ICD-10-CM | POA: Insufficient documentation

## 2020-03-07 DIAGNOSIS — F17210 Nicotine dependence, cigarettes, uncomplicated: Secondary | ICD-10-CM | POA: Insufficient documentation

## 2020-03-07 DIAGNOSIS — E119 Type 2 diabetes mellitus without complications: Secondary | ICD-10-CM | POA: Insufficient documentation

## 2020-03-07 DIAGNOSIS — M542 Cervicalgia: Secondary | ICD-10-CM | POA: Insufficient documentation

## 2020-03-07 LAB — URINALYSIS, COMPLETE (UACMP) WITH MICROSCOPIC
Bacteria, UA: NONE SEEN
Bilirubin Urine: NEGATIVE
Glucose, UA: NEGATIVE mg/dL
Ketones, ur: NEGATIVE mg/dL
Leukocytes,Ua: NEGATIVE
Nitrite: NEGATIVE
Protein, ur: NEGATIVE mg/dL
Specific Gravity, Urine: 1.017 (ref 1.005–1.030)
Squamous Epithelial / LPF: NONE SEEN (ref 0–5)
pH: 5 (ref 5.0–8.0)

## 2020-03-07 LAB — CBC
HCT: 43.9 % (ref 39.0–52.0)
Hemoglobin: 15.2 g/dL (ref 13.0–17.0)
MCH: 31.9 pg (ref 26.0–34.0)
MCHC: 34.6 g/dL (ref 30.0–36.0)
MCV: 92.2 fL (ref 80.0–100.0)
Platelets: 284 10*3/uL (ref 150–400)
RBC: 4.76 MIL/uL (ref 4.22–5.81)
RDW: 11.7 % (ref 11.5–15.5)
WBC: 6.8 10*3/uL (ref 4.0–10.5)
nRBC: 0 % (ref 0.0–0.2)

## 2020-03-07 LAB — BASIC METABOLIC PANEL
Anion gap: 10 (ref 5–15)
BUN: 17 mg/dL (ref 6–20)
CO2: 21 mmol/L — ABNORMAL LOW (ref 22–32)
Calcium: 9.1 mg/dL (ref 8.9–10.3)
Chloride: 103 mmol/L (ref 98–111)
Creatinine, Ser: 1.27 mg/dL — ABNORMAL HIGH (ref 0.61–1.24)
GFR calc Af Amer: >60 mL/min (ref >60)
GFR calc non Af Amer: >60 mL/min (ref >60)
Glucose, Bld: 256 mg/dL — ABNORMAL HIGH (ref 70–99)
Potassium: 4 mmol/L (ref 3.5–5.1)
Sodium: 134 mmol/L — ABNORMAL LOW (ref 135–145)

## 2020-03-07 LAB — TROPONIN I (HIGH SENSITIVITY): Troponin I (High Sensitivity): 4 ng/L (ref <18)

## 2020-03-07 IMAGING — CT CT ANGIO CHEST-ABD-PELV FOR DISSECTION W/ AND WO/W CM
2 of 7 series · 11 of 46 positions shown, 12 images · non-contrast
Comparison: Chest radiograph [DATE], CT abdomen pelvis
[DATE]

CLINICAL DATA: Neck pain and recurrent syncope

EXAM:
CT ANGIOGRAPHY CHEST, ABDOMEN AND PELVIS
TECHNIQUE: Non-contrast CT of the chest was initially obtained.

[Series 5: axial arterial · axial · arterial · 0.86mm/px · z∈[-991,-436]mm · 8 of 215 slices shown, 9 images]
[im 15/215  soft-tissue]
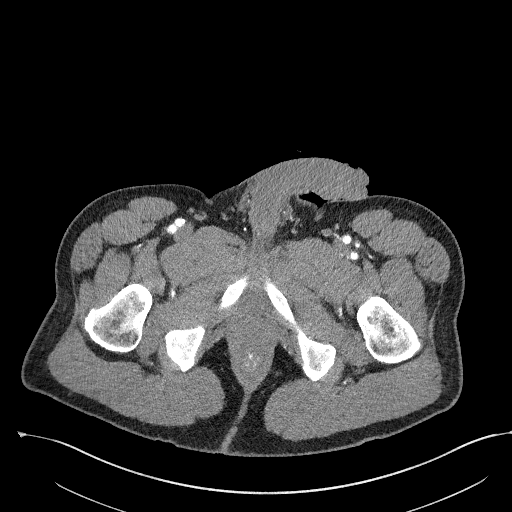
[im 15/215  bone]
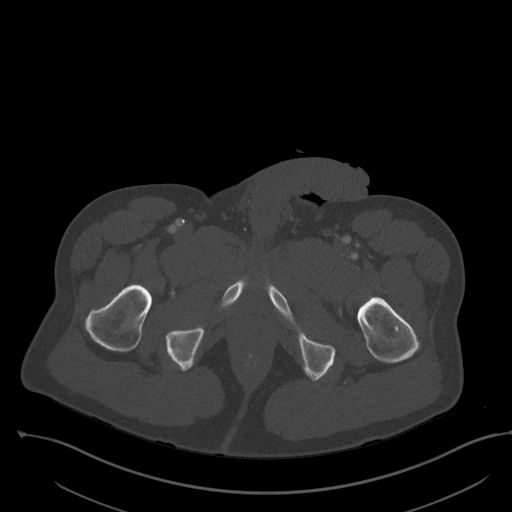
[im 43/215  soft-tissue]
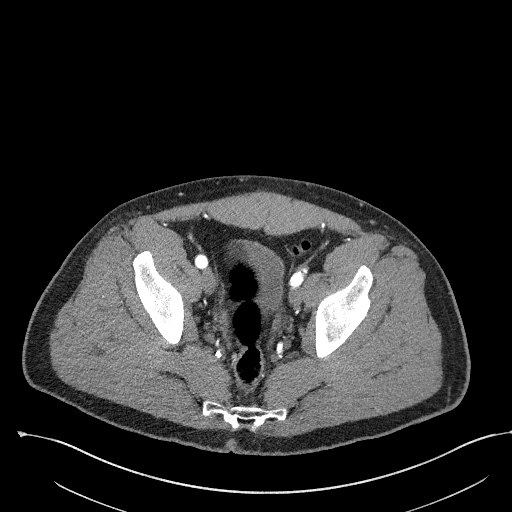
[im 72/215  soft-tissue]
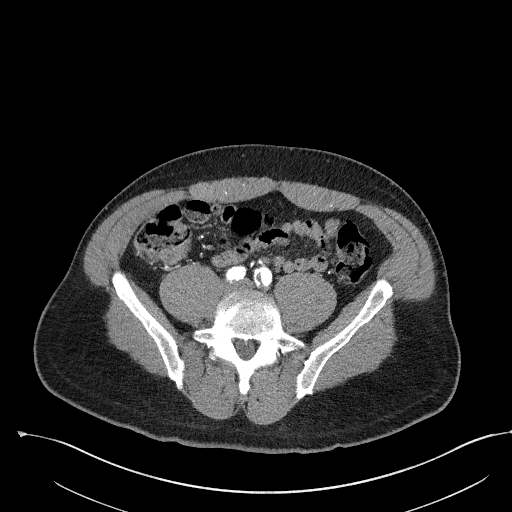
[im 100/215  soft-tissue]
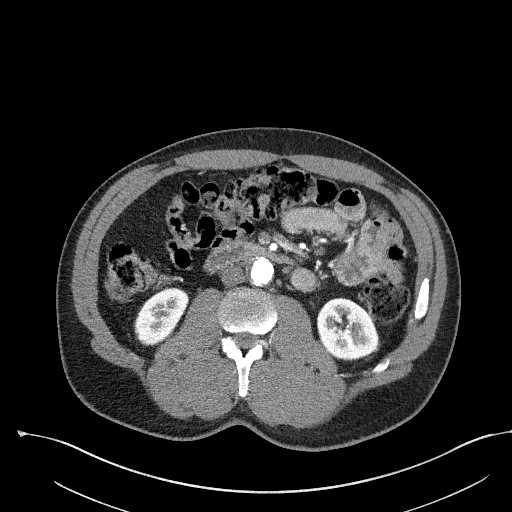
[im 115/215  soft-tissue]
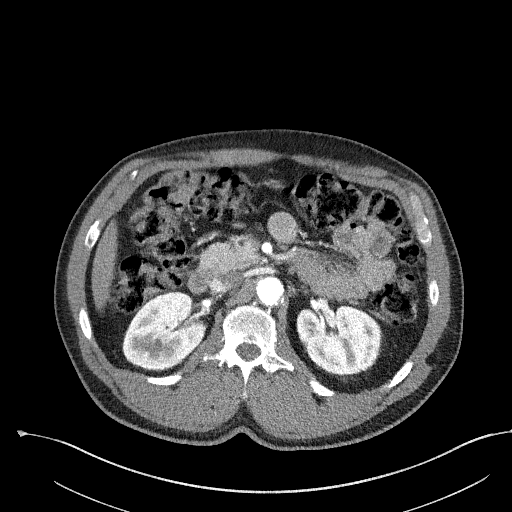
[im 143/215  soft-tissue]
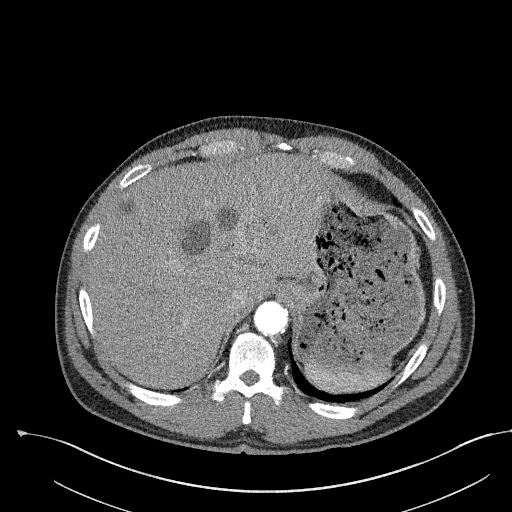
[im 172/215  soft-tissue]
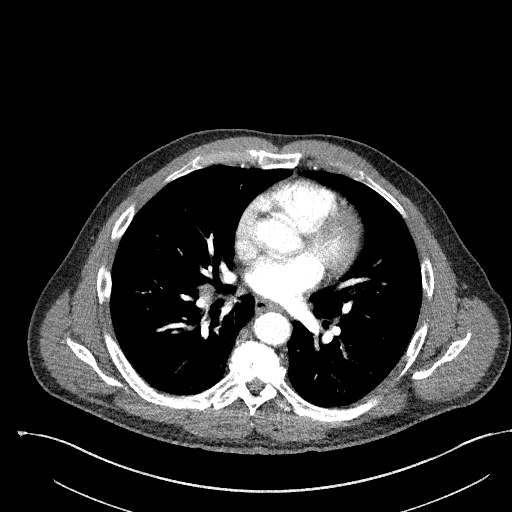
[im 200/215  soft-tissue]
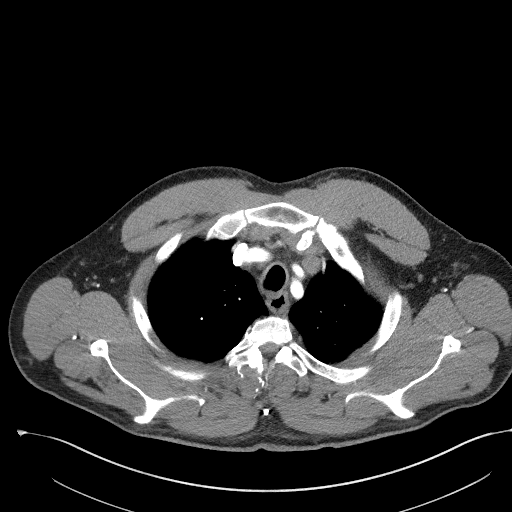

[Series 8: coronals · coronal · 0.78mm/px · 3 of 136 slices shown]
[im 34/136  soft-tissue]
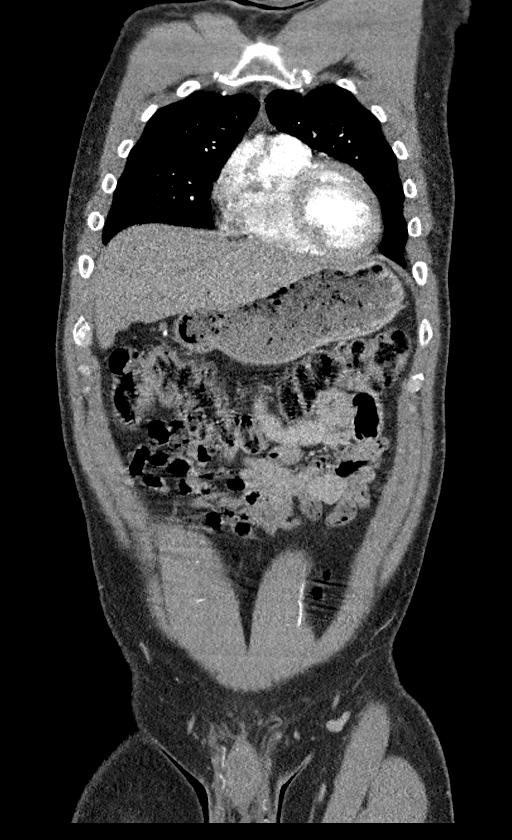
[im 68/136  soft-tissue]
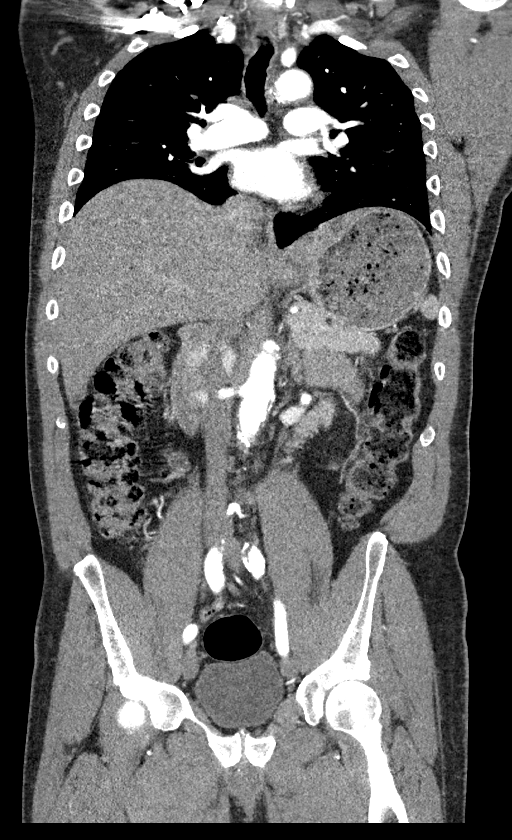
[im 102/136  soft-tissue]
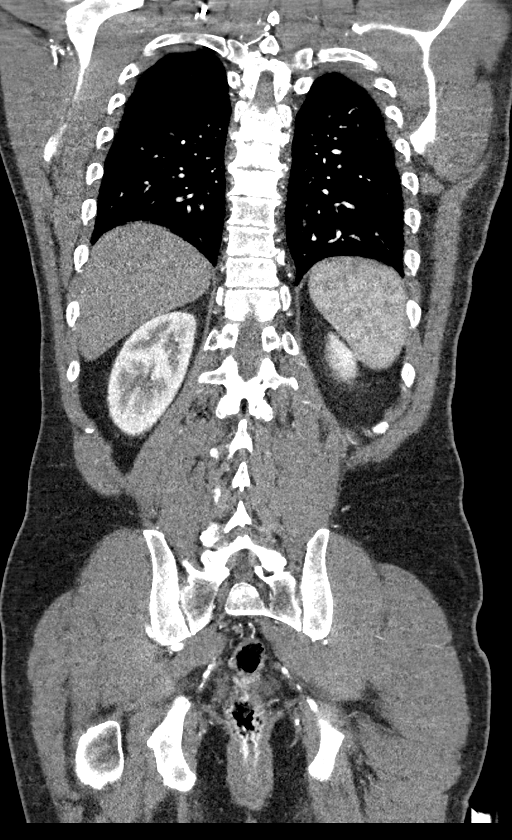

[11 of 46 positions shown; findings below may reference images not displayed]

Multidetector CT imaging through the chest, abdomen and pelvis was
performed using the standard protocol during bolus administration of
intravenous contrast. Multiplanar reconstructed images and MIPs were
obtained and reviewed to evaluate the vascular anatomy.

CONTRAST:  125mL OMNIPAQUE IOHEXOL 350 MG/ML SOLN
FINDINGS: CTA CHEST FINDINGS

Cardiovascular: Noncontrast CT images of the chest demonstrate a
normal caliber thoracic aorta with atherosclerotic calcification.
Postcontrast administration there is satisfactory opacification of
the thoracic aorta. The aortic root is suboptimally assessed given
cardiac pulsation artifact. No dissection flap or other acute
luminal abnormality of the aorta is seen. No periaortic stranding or
hemorrhage. Shared origin of the brachiocephalic and left common
carotid artery. Minimal plaque in the great vessels with some right
brachiocephalic tortuosity. Great vessels are otherwise
unremarkable. Central pulmonary arteries are normal caliber. No
large central filling defects are seen on this non tailored
examination of the pulmonary arteries. Normal heart size. No
pericardial effusion. Punctate focus of gas within the right
ventricle is likely related to intravenous access. Coronary artery
calcifications are present.

Mediastinum/Nodes: No mediastinal fluid or gas. Normal thyroid gland
and thoracic inlet. No acute abnormality of the trachea or
esophagus. No worrisome mediastinal, hilar or axillary adenopathy.

Lungs/Pleura: Mild airways thickening and scattered secretions may
reflect sequela of chronic bronchitis. Paraseptal and centrilobular
emphysematous changes are present throughout the lungs most
pronounced towards the lung apices. Dependent atelectatic changes
are likely accentuated by imaging during exhalation for the
angiographic technique. No consolidation, features of edema,
pneumothorax, or effusion. No suspicious pulmonary nodules or
masses.

Musculoskeletal: Multilevel degenerative changes are present in the
imaged portions of the spine. Wall focal sclerosis around the
endplates of T1, T2 and T11 are reasonably suggestive of
degenerative change the more diffusely sclerotic appearance of the
T5-T7 vertebrae is somewhat nonspecific. Degenerative changes are
present at these levels though the degree of sclerosis is quite
pronounced. No other acute or suspicious osseous lesion.

Review of the MIP images confirms the above findings.

CTA ABDOMEN AND PELVIS FINDINGS

VASCULAR

Aorta: Calcified noncalcified atheromatous plaque is seen throughout
the abdominal aorta. No flow-limiting stenosis. No evidence of
aneurysm, dissection or vasculitis.

Celiac: Ostial plaque results in minimal narrowing at the origin of
the celiac. Proximal vessel otherwise normally opacified without
significant stenosis. No evidence of aneurysm, dissection or
vasculitis.

SMA: Minimal plaque at the SMA ostium. No significant resulting
stenosis. Vessel is normally opacified. No evidence of aneurysm,
dissection or vasculitis.

Renals: Single bilateral renal arteries. Calcified and and
noncalcified plaque results in at most mild narrowing at the renal
artery origins, left slightly more pronounced than right. Additional
mild plaque more distally within the renal arteries without other
significant stenosis. No evidence of aneurysm, dissection or
vasculitis.

IMA: Ostial plaque resulting in severe stenosis or possible
occlusion with back filling via left colic collaterals. Vessel is
otherwise normally opacified without acute vascular abnormality.

Inflow: Plaque seen throughout the inflow vessels most pronounced in
the left common iliac with some mild narrowing as well as throughout
the bilateral internal iliac arteries. More plaque seen within the
external iliacs. Atherosclerosis of the proximal outflow vasculature
as well without significant stenosis, aneurysm or ectasia.

Veins: No obvious venous abnormality within the limitations of this
arterial phase study.

Review of the MIP images confirms the above findings.

NON-VASCULAR

Hepatobiliary: Multiple fluid attenuation cysts are seen again seen
throughout the liver, largest in segment 4 measuring up to 3.4 cm
(5/75) additional subcentimeter hypoattenuating foci throughout the
liver may reflect additional cysts though are too small to fully
characterize on CT imaging. Smooth liver surface contour. Normal
background liver attenuation and enhancement. No worrisome hepatic
lesions. Normal gallbladder. Normal biliary tree.

Pancreas: Stable fatty cleft at the tail of the pancreas. No
pancreatic ductal dilatation or surrounding inflammatory changes.

Spleen: Normal arterial enhancement pattern of the spleen. Normal
splenic size without concerning focal lesion.

Adrenals/Urinary Tract: Mild lobular thickening of the left adrenal
gland without discerning nodule or mass is similar to comparison.
Kidneys enhance symmetrically and are normally located. No
concerning renal mass, urolithiasis or hydronephrosis. Few vascular
calcifications in the renal hila. Urinary bladder is unremarkable.

Stomach/Bowel: Distal esophagus, stomach and duodenal sweep are
unremarkable. No small bowel wall thickening or dilatation. No
evidence of obstruction. A normal appendix is visualized. No colonic
dilatation or wall thickening.

Lymphatic: No suspicious or enlarged lymph nodes in the included
lymphatic chains.

Reproductive: Borderline prostatomegaly. No concerning CT
abnormalities.

Other: No abdominopelvic free air or fluid. No bowel containing
hernia.

Musculoskeletal: Multilevel degenerative changes are present in the
imaged portions of the spine. No acute osseous abnormality or
suspicious osseous lesion.

Review of the MIP images confirms the above findings.
IMPRESSION: Vascular finding:

1. No evidence of acute aortic syndrome.
2.  Aortic Atherosclerosis ([AS]-[AS]).
3. Ostial plaque at the origin of the IMA results in severe stenosis
or possible occlusion with potential back filling via left colic
collaterals.
4. Additional mild ostial narrowing seen at the celiac and renal
artery origins.

Nonvascular Findings:

1. No other acute findings in the chest, abdomen or pelvis.
2. Features suggesting chronic bronchitis and emphysema
([AS]-J[DATE] reflect smoking related changes.
3. Multilevel discogenic changes including some mild sclerotic
change at the endplates of the T1-2 and T11-12 levels. More
diffusely sclerotic appearance of the T5, T6 and T7 vertebrae are
favored to be degenerative as well in the absence of risk factors
such as history of known malignancy.
4. Borderline prostatomegaly.
5. Aortic Atherosclerosis ([AS]-[AS]).

## 2020-03-07 MED ORDER — IOHEXOL 350 MG/ML SOLN
125.0000 mL | Freq: Once | INTRAVENOUS | Status: AC | PRN
  Administered 2020-03-07: 125 mL via INTRAVENOUS

## 2020-03-07 NOTE — ED Notes (Addendum)
Pt states "what are we waiting for?" Pt notified of plan of care, informed of impending CT. Pt states "I'm about to walk up out of here, this is ruining my whole night being here." Pt provided reason for wait. Blanket offered, pt declines.

## 2020-03-07 NOTE — ED Triage Notes (Signed)
Pt arrived via POV with reports of neck pain x 1 week.  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 Pt also reports dizziness at times as well.  Pt states he has a CT scheduled tomorrow.

## 2020-03-07 NOTE — ED Notes (Signed)
Pt in CT.

## 2020-03-07 NOTE — ED Provider Notes (Signed)
[facility] Emergency Department Provider Note   ____________________________________________    I have reviewed the triage vital signs and the nursing notes.   HISTORY  Chief Complaint Dizziness and back pain    HPI Corey Rasmussen is a 59 y.o. male with history of diabetes, hypertension, who presents with complaints of 1 week of intermittent lightheadedness.  Patient reports that he feels like he is going to faint.  This seems to happen intermittently but happens once or twice per day for the last week.  He is a Administrator.  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  He has not started any new medications.  No drugs.  Does smoke cigarettes.  Does describe pain in his right shoulder blade.  No chest pain.  No palpitations.  No nausea vomiting or diaphoresis.  No headache or neuro deficits.  He denies neck pain to me at this time, he describes tightness in his posterior neck occasionally which he describes as stress related  Past Medical History:  Diagnosis Date  . Diabetes mellitus without complication ([place])   . Hypercholesteremia   . Hypertension     There are no problems to display for this patient.   Past Surgical History:  Procedure Laterality Date  . EYE SURGERY    . FOOT SURGERY    . HAND SURGERY Right   . KNEE SURGERY      Prior to Admission medications   Medication Sig Start Date End Date Taking? Authorizing Provider  amLODipine (NORVASC) 5 MG tablet Take 1 tablet (5 mg total) by mouth daily. 01/19/14   Harden Mo, MD  carisoprodol (SOMA) 350 MG tablet Take 1 tablet (350 mg total) by mouth 3 (three) times daily as needed for muscle spasms. 03/26/16   Schaevitz, Randall An, MD  cyclobenzaprine (FLEXERIL) 5 MG tablet Take 1 tablet (5 mg total) by mouth 3 (three) times daily as needed for muscle spasms. 01/08/14   Carvel Getting, NP  hydrochlorothiazide (HYDRODIURIL) 12.5 MG tablet Take 1 tablet (12.5 mg total) by mouth daily. 01/19/14   Harden Mo, MD  hydrocortisone 1 % ointment Apply 1 application topically 2 (two) times daily. 05/01/15   Paulette Blanch, MD  ibuprofen (ADVIL,MOTRIN) 800 MG tablet Take 1 tablet (800 mg total) by mouth 3 (three) times daily. 10/04/13   Teressa Lower, MD  metFORMIN (GLUCOPHAGE) 500 MG tablet Take 1,000 mg by mouth 2 (two) times daily with a meal.    [provider]  methylPREDNISolone (MEDROL) 4 MG tablet Take 1 tablet (4 mg total) by mouth daily. 03/26/16   Schaevitz, Randall An, MD  naproxen (NAPROSYN) 500 MG tablet Take 1 tablet (500 mg total) by mouth 2 (two) times daily. 01/08/14   Carvel Getting, NP     Allergies Patient has no known allergies.  No family history on file.  Social History Social History   Tobacco Use  . Smoking status: Current Every Day Smoker    Packs/day: 0.50    Types: Cigarettes  . Smokeless tobacco: Never Used  Substance Use Topics  . Alcohol use: Never  . Drug use: No    Review of Systems  Constitutional: No fever/chills Eyes: No visual changes.  ENT: No sore throat. Cardiovascular: As above Respiratory: Denies shortness of breath. Gastrointestinal: No abdominal pain.  No nausea, no vomiting.   Genitourinary: Negative for dysuria. Musculoskeletal: As above Skin: Negative for rash. Neurological: Negative for headaches or weakness   ____________________________________________  PHYSICAL EXAM:  VITAL SIGNS: ED Triage Vitals  Enc Vitals Group     BP 03/07/20 1700 124/81     Pulse Rate 03/07/20 1700 84     Resp 03/07/20 1700 18     Temp 03/07/20 1700 98.5 F (36.9 C)     Temp Source 03/07/20 1700 Oral     SpO2 03/07/20 1700 100 %     Weight 03/07/20 1658 96.6 kg (213 lb)     Height 03/07/20 1658 1.803 m (5\' 11" )     Head Circumference --      Peak Flow --      Pain Score 03/07/20 1658 4     Pain Loc --      Pain Edu? --      Excl. in [place]? --     Constitutional: Alert and oriented. No acute distress  Nose: No  congestion/rhinnorhea. Mouth/Throat: Mucous membranes are moist.   Neck: Mild tenderness to trapezius insertion sites bilaterally Cardiovascular: Normal rate, regular rhythm. Grossly normal heart sounds.  Good peripheral circulation. Respiratory: Normal respiratory effort.  No retractions.  Gastrointestinal: Soft and nontender. No distention.  No CVA tenderness.  Musculoskeletal: No lower extremity tenderness nor edema.  Warm and well perfused Neurologic:  Normal speech and language. No gross focal neurologic deficits are appreciated.  Skin:  Skin is warm, dry and intact. No rash noted. Psychiatric: Mood and affect are normal. Speech and behavior are normal.  ____________________________________________   LABS (all labs ordered are listed, but only abnormal results are displayed)  Labs Reviewed  BASIC METABOLIC PANEL - Abnormal; Notable for the following components:      Result Value   Sodium 134 (*)    CO2 21 (*)    Glucose, Bld 256 (*)    Creatinine, Ser 1.27 (*)    All other components within normal limits  URINALYSIS, COMPLETE (UACMP) WITH MICROSCOPIC - Abnormal; Notable for the following components:   Color, Urine YELLOW (*)    APPearance CLEAR (*)    Hgb urine dipstick SMALL (*)    All other components within normal limits  CBC  TROPONIN I (HIGH SENSITIVITY)   ____________________________________________  EKG  ED ECG REPORT I, Lavonia Drafts, the attending physician, personally viewed and interpreted this ECG.  Date: 03/07/2020  Rhythm: normal sinus rhythm QRS Axis: normal Intervals: normal ST/T Wave abnormalities: ST changes noted Narrative Interpretation: ST changes appear new  ____________________________________________  RADIOLOGY  CT angiography ____________________________________________   PROCEDURES  Procedure(s) performed: No  Procedures   Critical Care performed: No ____________________________________________   INITIAL IMPRESSION /  ASSESSMENT AND PLAN / ED COURSE  Pertinent labs & imaging results that were available during my care of the patient were reviewed by me and considered in my medical decision making (see chart for details).  Patient presents with intermittent episodes of lightheadedness with a complaint of back pain.  He states that when he lies on his abdomen he feels very lightheaded.  Differential includes arrhythmia, dissection, less likely ACS given lack of chest pain.,  Electrolyte abnormalities.  Patient's lab work is overall reassuring, normal CBC.  Electrolytes are within normal ranges.  Cardiac enzymes are normal.  We will send for CT angiography to evaluate his aorta given atypical back pain with lightheadedness as well as lightheadedness with lying prone  CT angiography does not demonstrate any acute vascular abnormalities.  Patient remains well-appearing here in the emergency department.  We will have him follow-up closely with cardiology for further evaluation.  Return precautions discussed    ____________________________________________   FINAL CLINICAL IMPRESSION(S) / ED DIAGNOSES  Final diagnoses:  Dizziness        Note:  This document was prepared using Dragon voice recognition software and may include unintentional dictation errors.   Lavonia Drafts, MD 03/07/20 2225

## 2020-03-11 ENCOUNTER — Ambulatory Visit: Payer: Self-pay | Admitting: Cardiology

## 2020-03-12 ENCOUNTER — Encounter: Payer: Self-pay | Admitting: Cardiology

## 2020-03-13 ENCOUNTER — Emergency Department: Payer: Self-pay

## 2020-03-13 ENCOUNTER — Emergency Department
Admission: EM | Admit: 2020-03-13 | Discharge: 2020-03-13 | Disposition: A | Discharge status: Other Acute Inpatient Hospital | Payer: Self-pay | Attending: Emergency Medicine | Admitting: Emergency Medicine

## 2020-03-13 ENCOUNTER — Other Ambulatory Visit: Payer: Self-pay

## 2020-03-13 DIAGNOSIS — M542 Cervicalgia: Secondary | ICD-10-CM | POA: Insufficient documentation

## 2020-03-13 DIAGNOSIS — E119 Type 2 diabetes mellitus without complications: Secondary | ICD-10-CM | POA: Insufficient documentation

## 2020-03-13 DIAGNOSIS — G9389 Other specified disorders of brain: Secondary | ICD-10-CM

## 2020-03-13 DIAGNOSIS — R12 Heartburn: Secondary | ICD-10-CM | POA: Insufficient documentation

## 2020-03-13 DIAGNOSIS — R42 Dizziness and giddiness: Secondary | ICD-10-CM

## 2020-03-13 DIAGNOSIS — Z7984 Long term (current) use of oral hypoglycemic drugs: Secondary | ICD-10-CM | POA: Insufficient documentation

## 2020-03-13 DIAGNOSIS — Z79899 Other long term (current) drug therapy: Secondary | ICD-10-CM | POA: Insufficient documentation

## 2020-03-13 DIAGNOSIS — F17210 Nicotine dependence, cigarettes, uncomplicated: Secondary | ICD-10-CM | POA: Insufficient documentation

## 2020-03-13 DIAGNOSIS — I10 Essential (primary) hypertension: Secondary | ICD-10-CM | POA: Insufficient documentation

## 2020-03-13 DIAGNOSIS — R569 Unspecified convulsions: Secondary | ICD-10-CM | POA: Insufficient documentation

## 2020-03-13 DIAGNOSIS — R900 Intracranial space-occupying lesion found on diagnostic imaging of central nervous system: Secondary | ICD-10-CM | POA: Insufficient documentation

## 2020-03-13 LAB — TROPONIN I (HIGH SENSITIVITY)
Troponin I (High Sensitivity): 4 ng/L (ref <18)
Troponin I (High Sensitivity): 4 ng/L (ref <18)

## 2020-03-13 LAB — COMPREHENSIVE METABOLIC PANEL
ALT: 9 U/L (ref 0–44)
AST: 14 U/L — ABNORMAL LOW (ref 15–41)
Albumin: 4 g/dL (ref 3.5–5.0)
Alkaline Phosphatase: 56 U/L (ref 38–126)
Anion gap: 8 (ref 5–15)
BUN: 16 mg/dL (ref 6–20)
CO2: 27 mmol/L (ref 22–32)
Calcium: 9.7 mg/dL (ref 8.9–10.3)
Chloride: 102 mmol/L (ref 98–111)
Creatinine, Ser: 1.18 mg/dL (ref 0.61–1.24)
GFR calc Af Amer: >60 mL/min (ref >60)
GFR calc non Af Amer: >60 mL/min (ref >60)
Glucose, Bld: 216 mg/dL — ABNORMAL HIGH (ref 70–99)
Potassium: 3.9 mmol/L (ref 3.5–5.1)
Sodium: 137 mmol/L (ref 135–145)
Total Bilirubin: 0.6 mg/dL (ref 0.3–1.2)
Total Protein: 7.8 g/dL (ref 6.5–8.1)

## 2020-03-13 LAB — LIPASE, BLOOD: Lipase: 67 U/L — ABNORMAL HIGH (ref 11–51)

## 2020-03-13 LAB — CBC
HCT: 44.2 % (ref 39.0–52.0)
Hemoglobin: 15.7 g/dL (ref 13.0–17.0)
MCH: 31.8 pg (ref 26.0–34.0)
MCHC: 35.5 g/dL (ref 30.0–36.0)
MCV: 89.5 fL (ref 80.0–100.0)
Platelets: 276 10*3/uL (ref 150–400)
RBC: 4.94 MIL/uL (ref 4.22–5.81)
RDW: 11.7 % (ref 11.5–15.5)
WBC: 11.4 10*3/uL — ABNORMAL HIGH (ref 4.0–10.5)
nRBC: 0 % (ref 0.0–0.2)

## 2020-03-13 LAB — URINALYSIS, COMPLETE (UACMP) WITH MICROSCOPIC
Bacteria, UA: NONE SEEN
Bilirubin Urine: NEGATIVE
Glucose, UA: NEGATIVE mg/dL
Ketones, ur: NEGATIVE mg/dL
Leukocytes,Ua: NEGATIVE
Nitrite: NEGATIVE
Protein, ur: NEGATIVE mg/dL
Specific Gravity, Urine: 1.029 (ref 1.005–1.030)
Squamous Epithelial / HPF: NONE SEEN (ref 0–5)
pH: 5 (ref 5.0–8.0)

## 2020-03-13 IMAGING — MR MR HEAD WO/W CM
16 of 19 series · 38 of 48 positions shown · IV contrast (gadavist)
Comparison: None.

CLINICAL DATA: Initial evaluation for recurrent syncope, dizziness.

EXAM:
MRI HEAD WITHOUT AND WITH CONTRAST
MRI CERVICAL SPINE WITHOUT CONTRAST
TECHNIQUE: Multiplanar, multiecho pulse sequences of the brain and surrounding
structures, and cervical spine, to include the craniocervical
junction and cervicothoracic junction, were obtained without and
with intravenous contrast.
CONTRAST:  7.5mL GADAVIST GADOBUTROL 1 MMOL/ML IV SOLN

[Series 5: ax dwi_tracew · axial · 3.0mm · 0.60mm/px · z∈[-97,+56]mm · 2 of 48 slices shown]
[im 1/48]
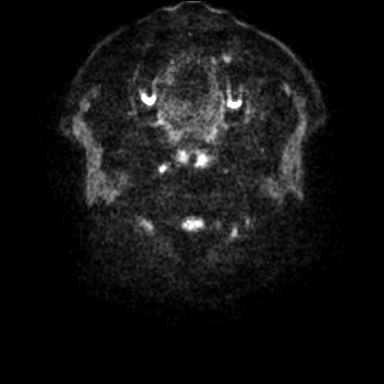
[im 48/48]
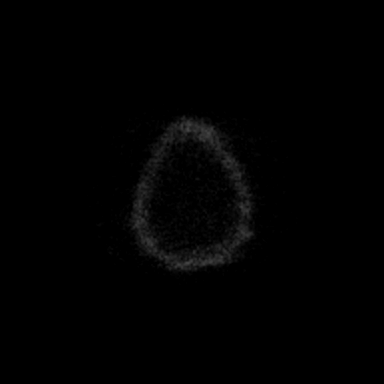

[Series 6: ax dwi_adc · axial · 3.0mm · 0.60mm/px · z∈[-97,+56]mm · 2 of 48 slices shown]
[im 1/48]
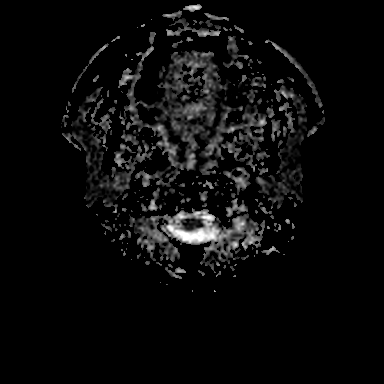
[im 48/48]
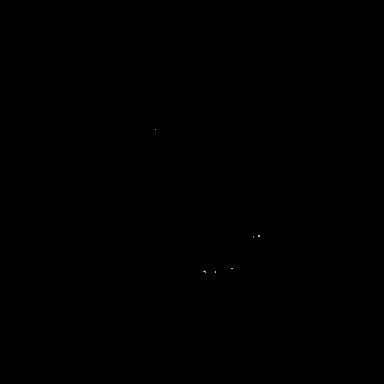

[Series 7: cor dwi_tracew · coronal · 5.0mm · 0.60mm/px · 1 of 38 slices shown]
[im 1/38]
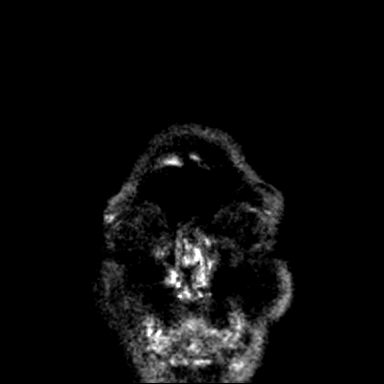

[Series 8: cor dwi_adc · coronal · 5.0mm · 0.60mm/px · 1 of 38 slices shown]
[im 1/38]
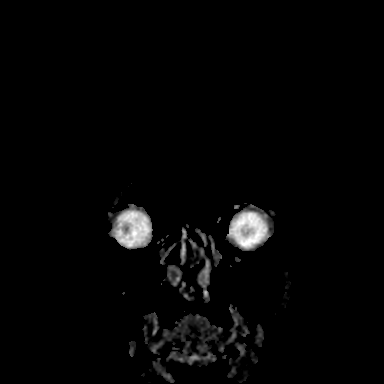

[Series 10: T2 · axial · 5.0mm · 0.53mm/px · 1 of 27 slices shown (1 of 3)]
[im 1/27]
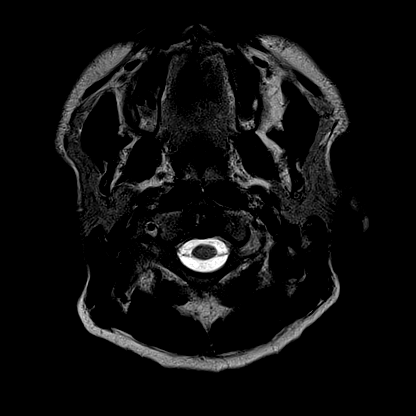

[Series 15: FLAIR · axial · 3.0mm · 0.53mm/px · z∈[-104,+56]mm · 3 of 55 slices shown (1 of 2)]
[im 1/55]
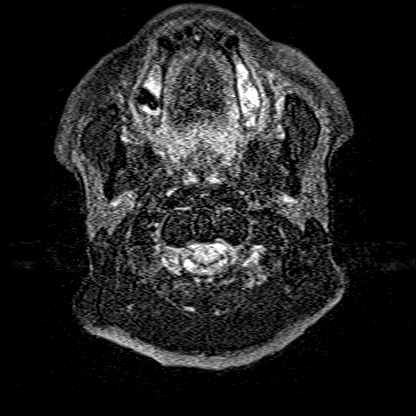
[im 28/55]
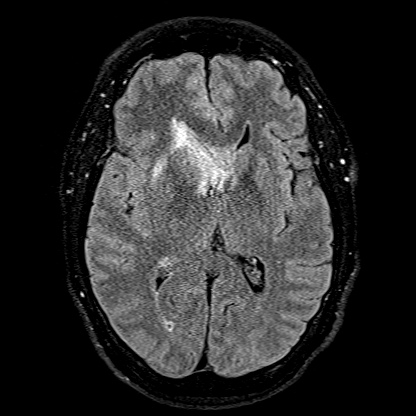
[im 55/55]
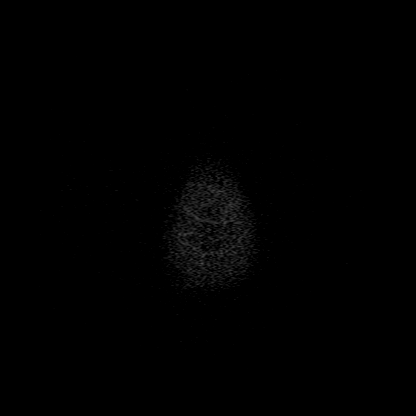

[Series 16: T1 · axial · 1.0mm · 0.98mm/px · z∈[-100,+57]mm · 8 of 160 slices shown (1 of 2)]
[im 1/160]
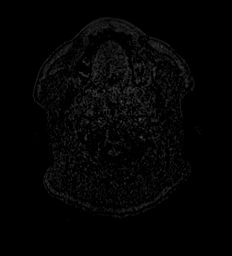
[im 20/160]
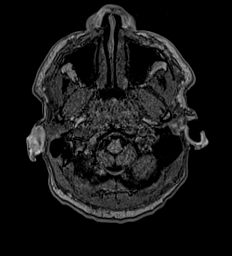
[im 40/160]
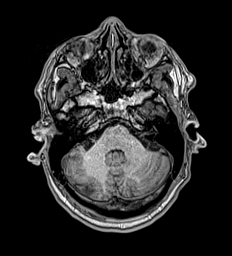
[im 60/160]
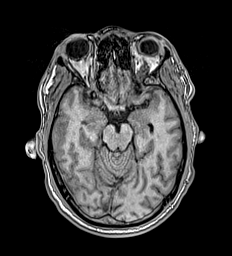
[im 100/160]
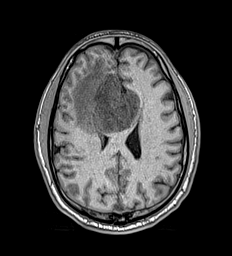
[im 120/160]
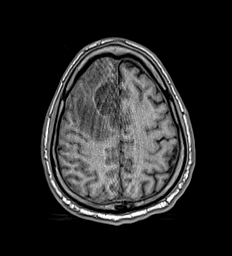
[im 140/160]
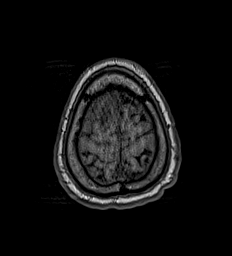
[im 160/160]
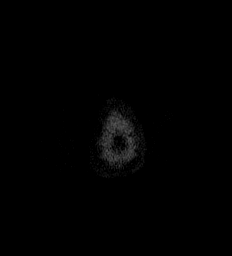

[Series 17: T1 · sagittal · 5.0mm · 0.62mm/px · 1 of 21 slices shown (2 of 2)]
[im 1/21]
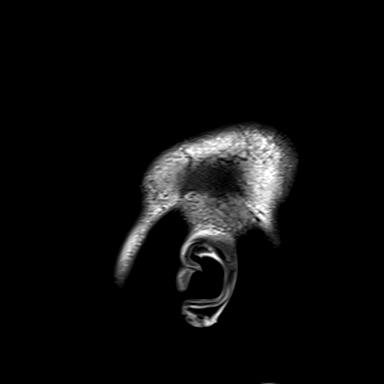

[Series 22: T2 · sagittal · 3.0mm · 0.62mm/px · 1 of 15 slices shown (2 of 3)]
[im 1/15]
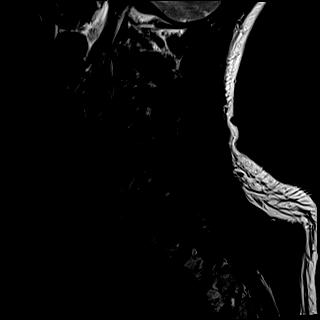

[Series 23: FLAIR · sagittal · 3.0mm · 0.78mm/px · 1 of 15 slices shown (2 of 2)]
[im 1/15]
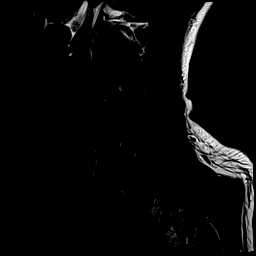

[Series 24: STIR · sagittal · 3.0mm · 0.62mm/px · 1 of 15 slices shown]
[im 1/15]
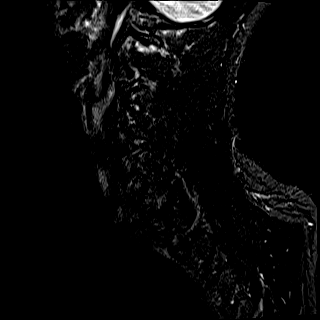

[Series 25: T2 · axial · 3.0mm · 0.70mm/px · z∈[-274,-180]mm · 2 of 30 slices shown (3 of 3)]
[im 1/30]
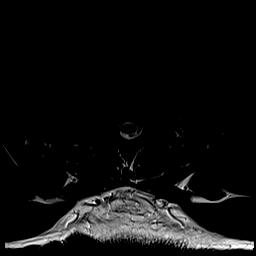
[im 30/30]
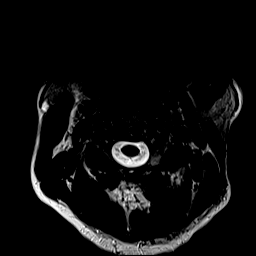

[Series 27: T2 post-contrast · coronal · 5.0mm · 0.57mm/px · 2 of 30 slices shown]
[im 1/30]
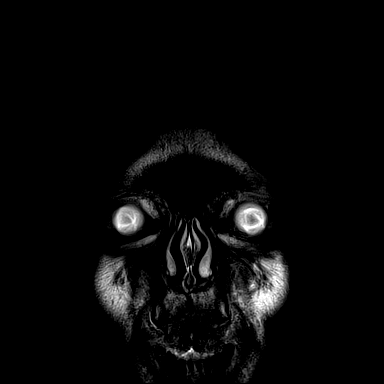
[im 30/30]
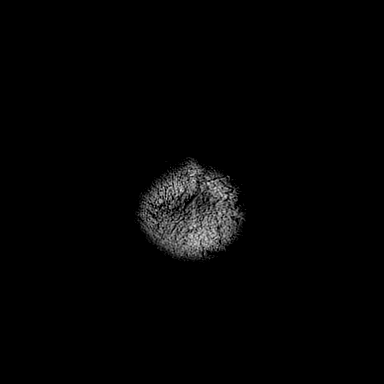

[Series 28: T1 post-contrast · axial · 1.0mm · 0.98mm/px · z∈[-100,+57]mm · 9 of 160 slices shown (1 of 3)]
[im 1/160]
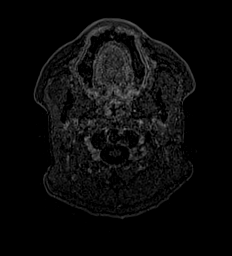
[im 20/160]
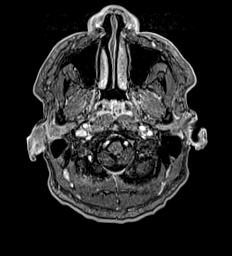
[im 40/160]
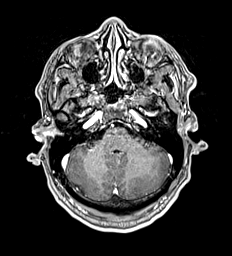
[im 60/160]
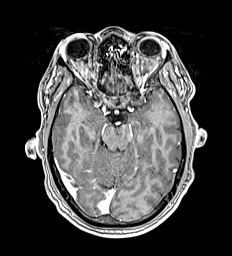
[im 80/160]
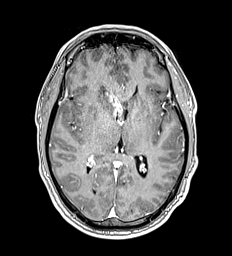
[im 100/160]
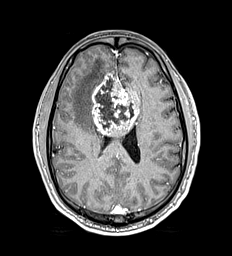
[im 120/160]
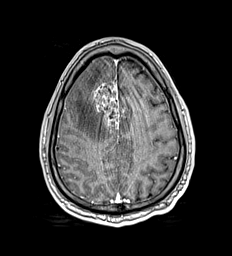
[im 140/160]
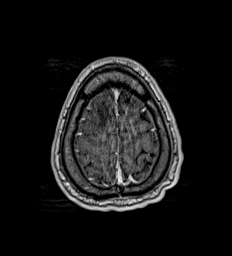
[im 160/160]
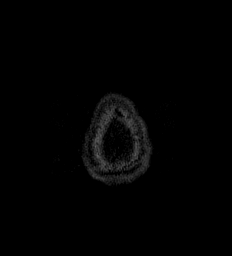

[Series 29: T1 post-contrast · coronal · 5.0mm · 0.57mm/px · 2 of 30 slices shown (2 of 3)]
[im 1/30]
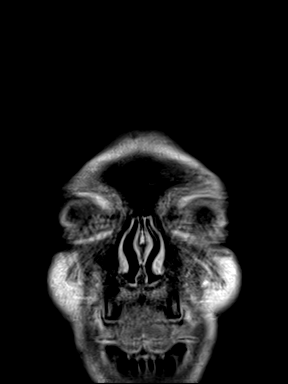
[im 30/30]
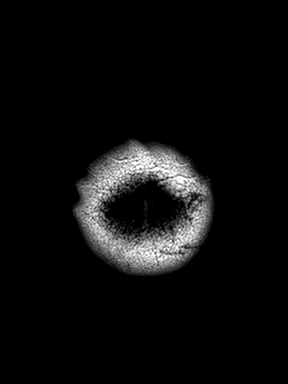

[Series 30: T1 post-contrast · sagittal · 5.0mm · 0.62mm/px · 1 of 21 slices shown (3 of 3)]
[im 1/21]
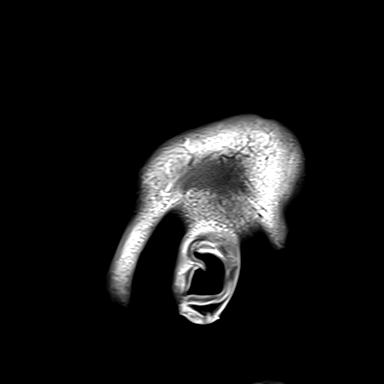

[38 of 48 positions shown; findings below may reference images not displayed]

FINDINGS: MRI HEAD FINDINGS

Brain: Heterogeneous and avidly enhancing mass straddling the
anterior falx at the level of the body and genu of the anterior
corpus callosum measures 6.2 x 4.7 x 5.1 cm in greatest dimensions
(AP by transverse by craniocaudad). Scattered areas of internal
susceptibility artifact compatible with necrosis. Associated
T2/FLAIR hyperintensity throughout the adjacent right frontal lobe
consistent with vasogenic edema and/or nonenhancing infiltrative
tumor. Small satellite nodular focus of enhancement measuring 7 mm
noted just superiorly at the parasagittal left frontal lobe (series
28, image 127). Finding highly concerning for a high-grade glioma.
Secondary mass effect on the adjacent lateral ventricles which are
compressed and effaced anteriorly. No hydrocephalus or ventricular
trapping. No definite ependymal spread of tumor through the
ventricular system. LOCALIZED right-to-left shift measures up to 5
mm.

Otherwise, remainder the brain is normal in appearance. No evidence
for acute or subacute infarct. Gray-white matter differentiation
otherwise maintained. No other areas of acute or chronic
intracranial hemorrhage. No other mass lesion or abnormal
enhancement. No extra-axial fluid collection.

Vascular: Major intracranial vascular flow voids are maintained.
Mass effect on the anterior cerebral arteries which are displaced
slightly to the left and course along the anterior margin and
partially through the midline mass (series 10, images 15, 16).

Skull and upper cervical spine: Craniocervical junction within
normal limits. Bone marrow signal intensity normal. No scalp soft
tissue abnormality.

Sinuses/Orbits: Globes and orbital soft tissues within normal
limits. Paranasal sinuses are largely clear. No mastoid effusion.
Inner ear structures grossly normal.

Other: None.

MRI CERVICAL SPINE FINDINGS

Alignment: Examination moderately degraded by motion artifact.

Straightening of the normal cervical lordosis. Trace anterolisthesis
of C7 on T1, chronic and facet mediated.

Vertebrae: Vertebral body height maintained without evidence for
acute or chronic fracture. Bone marrow signal intensity within
normal limits. No discrete or worrisome osseous lesions. Discogenic
reactive endplate changes with associated marrow edema present about
the T1-2 interspace. No other abnormal marrow edema.

Cord: Signal intensity within the cervical spinal cord grossly
within normal limits.

Posterior Fossa, vertebral arteries, paraspinal tissues:
Craniocervical junction normal. Paraspinous and prevertebral soft
tissues within normal limits. Normal flow voids seen within the
vertebral arteries bilaterally.

Disc levels:

C2-C3: Diffuse disc bulge with bilateral uncovertebral hypertrophy.
Broad posterior disc osteophyte flattens and partially faces the
ventral thecal sac resultant mild spinal stenosis. Moderate right
with mild left C3 foraminal stenosis.

C3-C4: Chronic intervertebral disc space narrowing with diffuse
degenerative disc osteophyte. Broad posterior component flattens and
indents the ventral thecal sac. Secondary mild to moderate spinal
stenosis with mild cord flattening. No cord signal changes. Moderate
bilateral C4 foraminal stenosis, worse on the right.

C4-C5: Mild disc bulge with uncovertebral hypertrophy. Superimposed
bilateral facet degeneration. Mild spinal stenosis without cord
deformity. Moderate bilateral C5 foraminal stenosis.

C5-C6: Chronic intervertebral disc space narrowing with diffuse
degenerative disc osteophyte. Flattening and effacement of the
ventral thecal sac with resultant mild spinal stenosis. No cord
deformity. Moderate left worse than right C6 foraminal narrowing.

C6-C7: Chronic intervertebral disc space narrowing with diffuse disc
osteophyte. No significant spinal stenosis. Severe left with
moderate right C7 foraminal stenosis.

C7-T1: Normal interspace. Moderate bilateral facet hypertrophy. No
spinal stenosis. Mild bilateral C8 foraminal narrowing.

T1-2: Diffuse disc bulge with reactive endplate changes. Moderate
bilateral facet hypertrophy. Mild spinal stenosis without cord
deformity. Severe right with moderate left foraminal narrowing.
IMPRESSION: MRI HEAD IMPRESSION:

1. 6.2 x 4.7 x 5.1 cm enhancing mass centered at the anterior corpus
callosum, most compatible with a high-grade glioma/glioblastoma
multiforme. Associated FLAIR signal intensity throughout the
adjacent right frontal lobe compatible with associated edema and/or
nonenhancing tumor. Associated 5 mm right-to-left midline shift. No
hydrocephalus or ventricular trapping.
2. Associated 7 mm satellite nodular focus of enhancement at the
parasagittal left frontal lobe as above.

MRI CERVICAL SPINE IMPRESSION:

1. No acute abnormality within the cervical spine.
2. Moderate multilevel cervical spondylosis with resultant
mild-to-moderate diffuse spinal stenosis, most pronounced at C3-4.
Moderate to severe multilevel foraminal narrowing as above.

## 2020-03-13 IMAGING — CR DG CHEST 1V
1 series · 1 of 1 positions shown · non-contrast
Comparison: [REDACTED] [LV]

CLINICAL DATA: Dizziness

EXAM:
CHEST  1 VIEW

[chest pa]
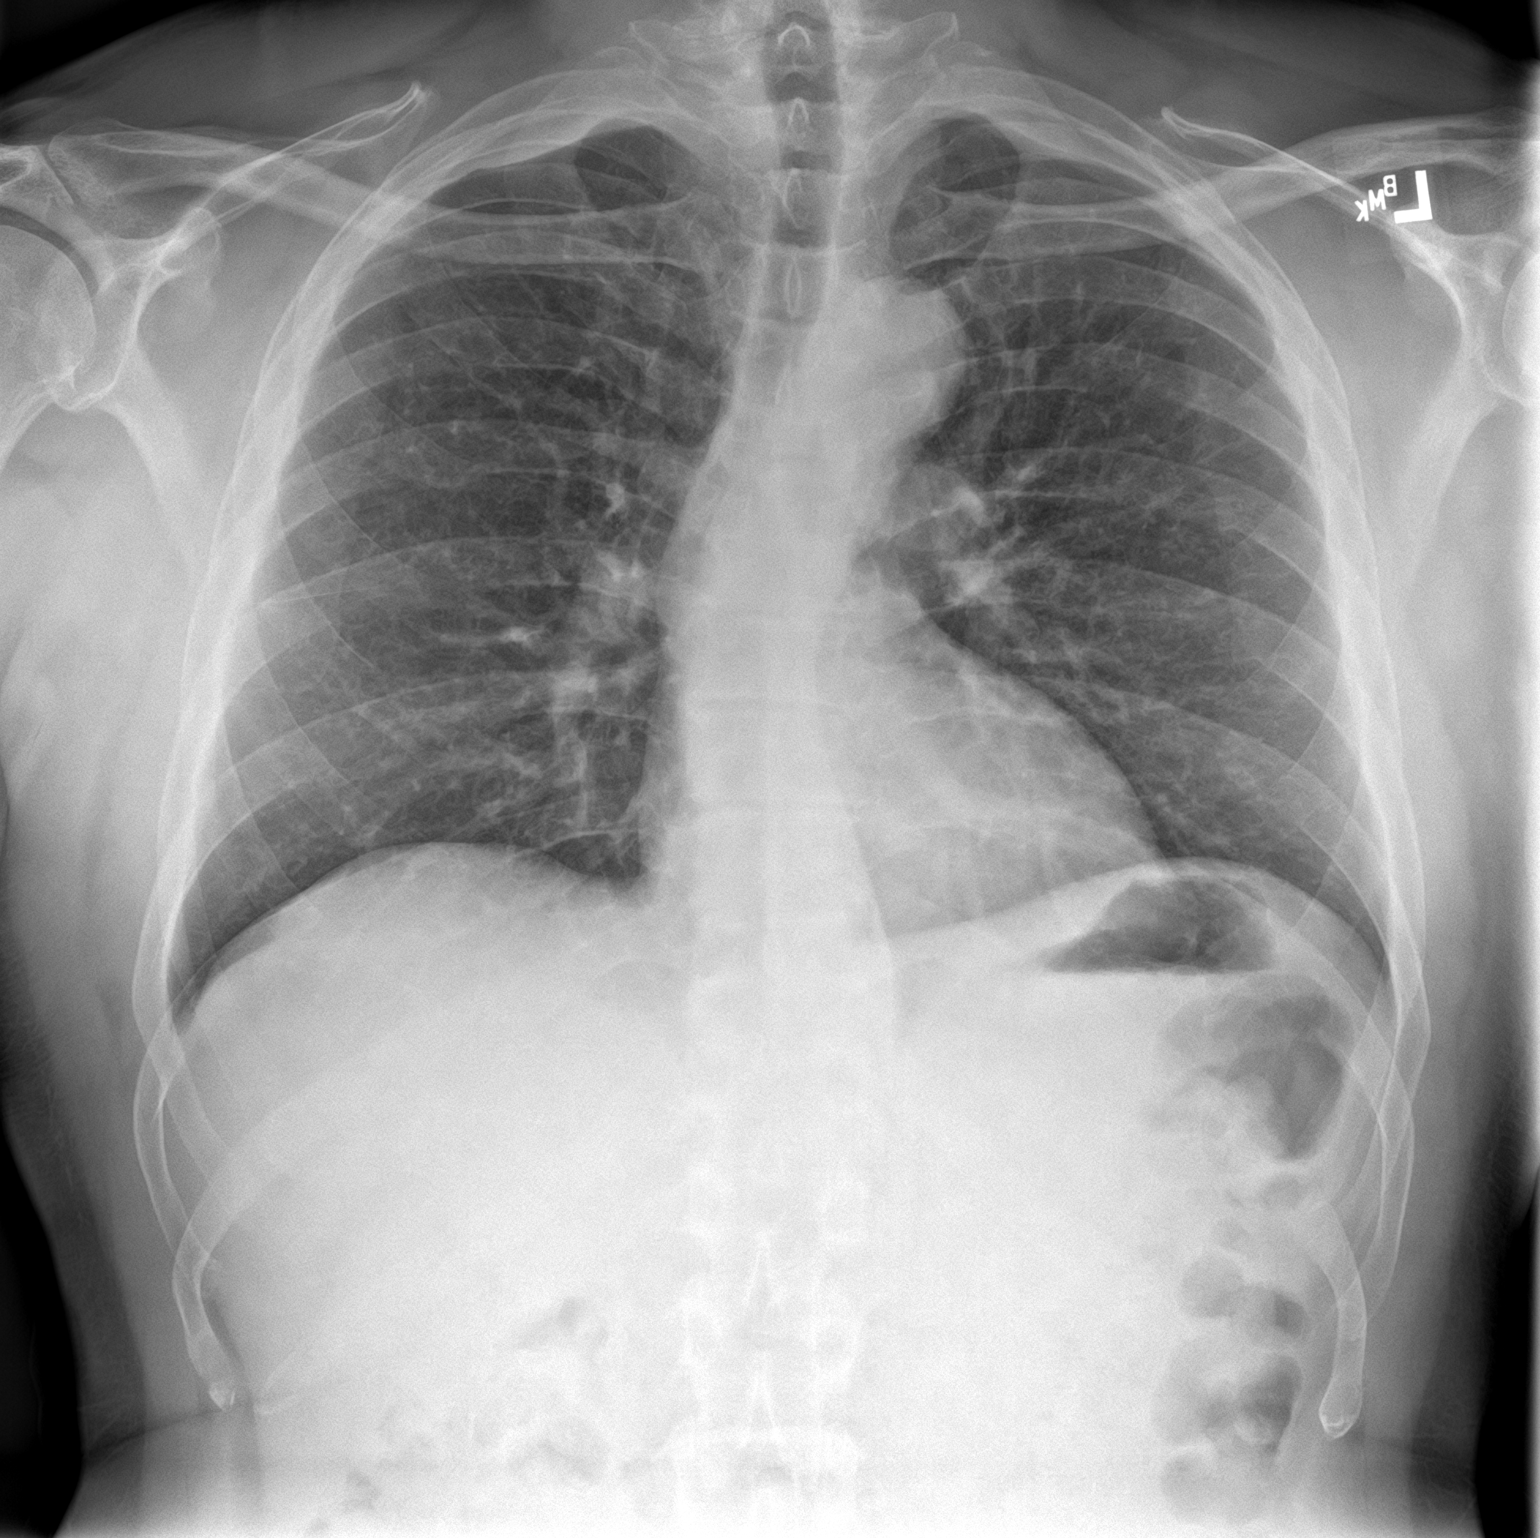

[1 of 1 positions shown; findings below may reference images not displayed]

FINDINGS: The heart size and mediastinal contours are within normal limits.
Both lungs are clear. The visualized skeletal structures are
unremarkable.
IMPRESSION: No active disease.

## 2020-03-13 IMAGING — MR MR CERVICAL SPINE W/O CM
5 series · 33 of 48 positions shown · IV contrast (gadavist)
Comparison: None.

CLINICAL DATA: Initial evaluation for recurrent syncope, dizziness.

EXAM:
MRI HEAD WITHOUT AND WITH CONTRAST
MRI CERVICAL SPINE WITHOUT CONTRAST
TECHNIQUE: Multiplanar, multiecho pulse sequences of the brain and surrounding
structures, and cervical spine, to include the craniocervical
junction and cervicothoracic junction, were obtained without and
with intravenous contrast.
CONTRAST:  7.5mL GADAVIST GADOBUTROL 1 MMOL/ML IV SOLN

[Series 22: T2 · sagittal · 3.0mm · 0.62mm/px · 6 of 15 slices shown (1 of 2)]
[im 1/15]
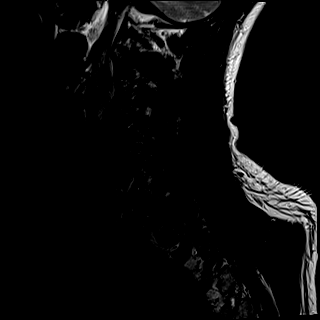
[im 3/15]
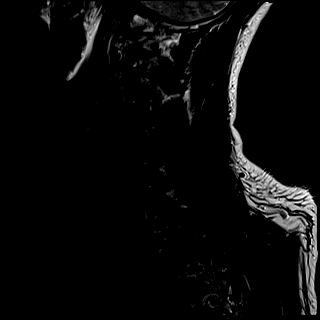
[im 6/15]
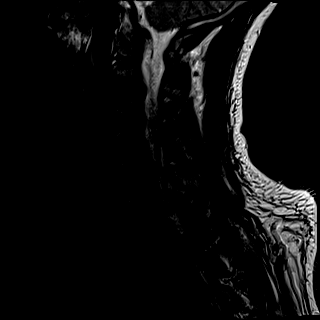
[im 9/15]
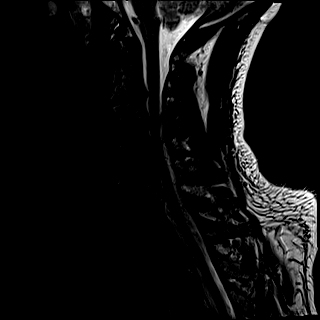
[im 12/15]
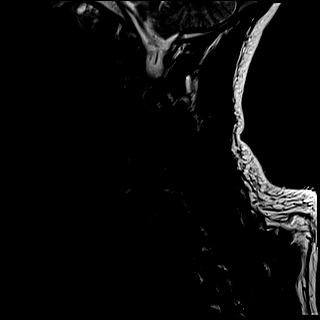
[im 15/15]
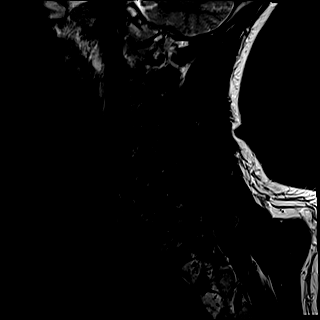

[Series 23: FLAIR · sagittal · 3.0mm · 0.78mm/px · 7 of 15 slices shown]
[im 1/15]
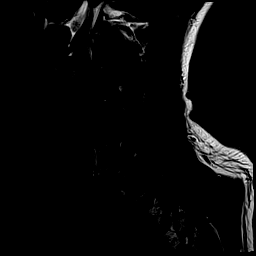
[im 3/15]
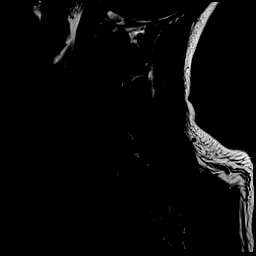
[im 5/15]
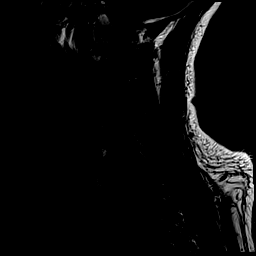
[im 8/15]
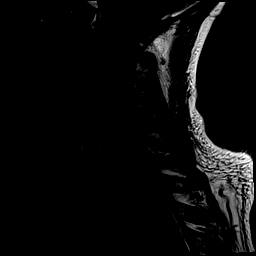
[im 10/15]
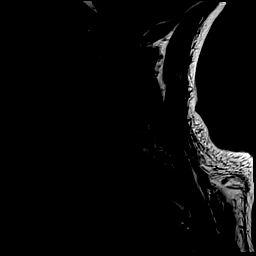
[im 12/15]
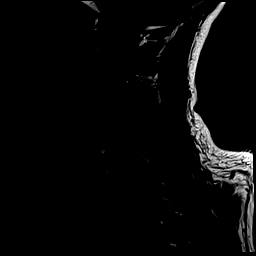
[im 15/15]
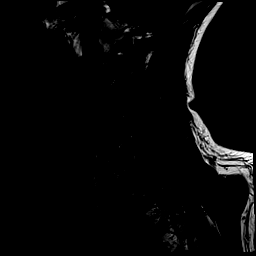

[Series 24: STIR · sagittal · 3.0mm · 0.62mm/px · 7 of 15 slices shown]
[im 1/15]
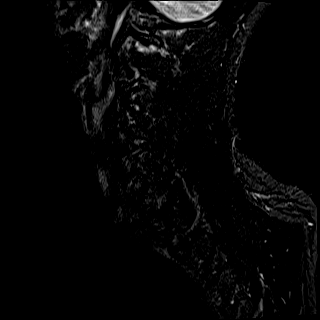
[im 3/15]
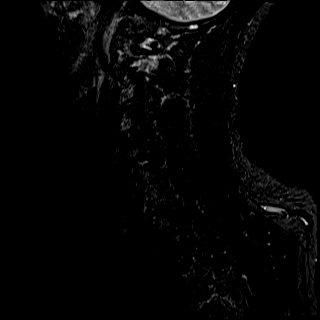
[im 5/15]
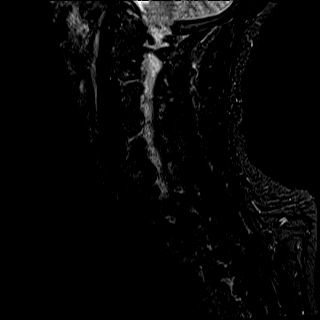
[im 8/15]
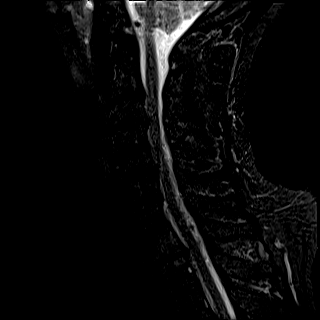
[im 10/15]
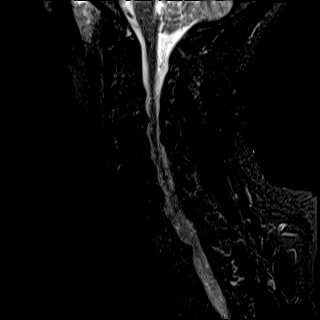
[im 12/15]
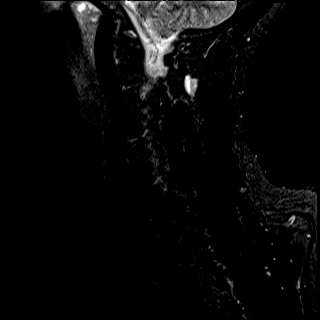
[im 15/15]
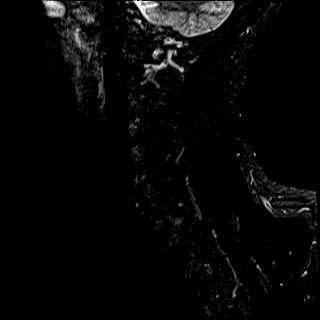

[Series 25: T2 · axial · 3.0mm · 0.70mm/px · z∈[-274,-180]mm · 8 of 30 slices shown (2 of 2)]
[im 1/30]
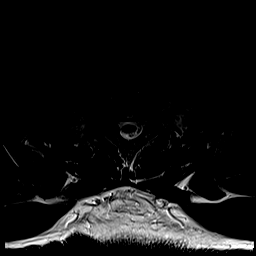
[im 5/30]
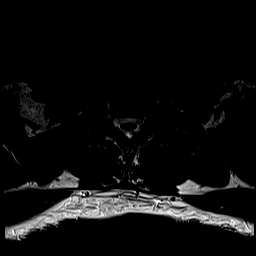
[im 9/30]
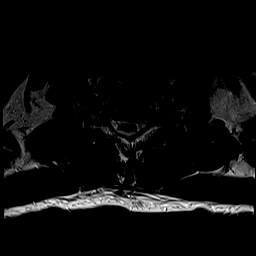
[im 14/30]
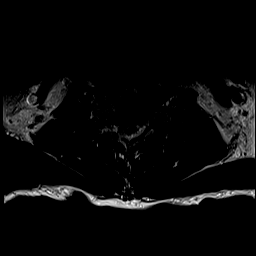
[im 16/30]
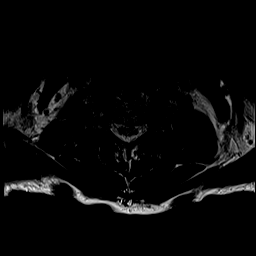
[im 21/30]
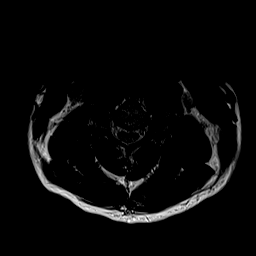
[im 25/30]
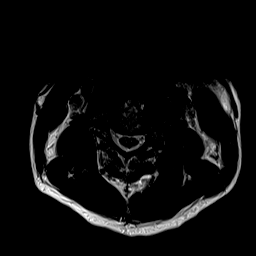
[im 30/30]
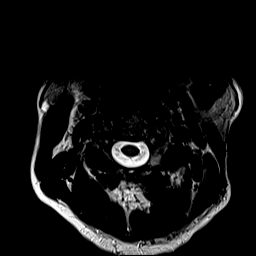

[Series 26: ax mpgr · axial · 3.0mm · 0.35mm/px · z∈[-274,-226]mm · 5 of 30 slices shown]
[im 1/30]
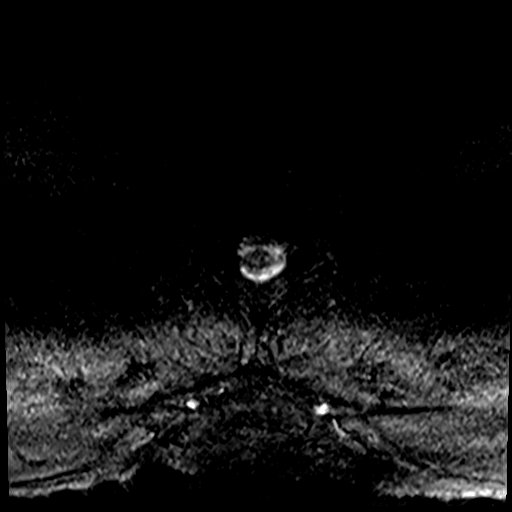
[im 5/30]
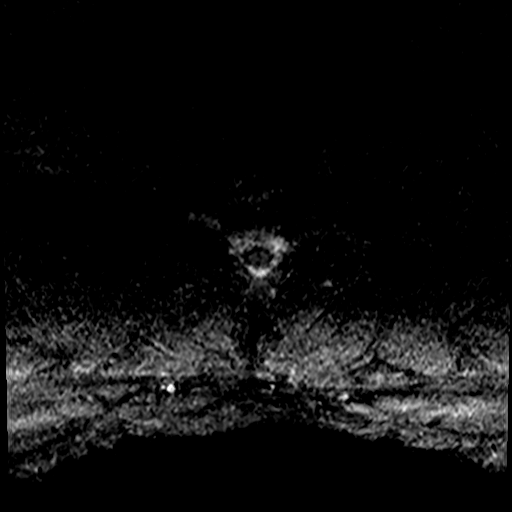
[im 9/30]
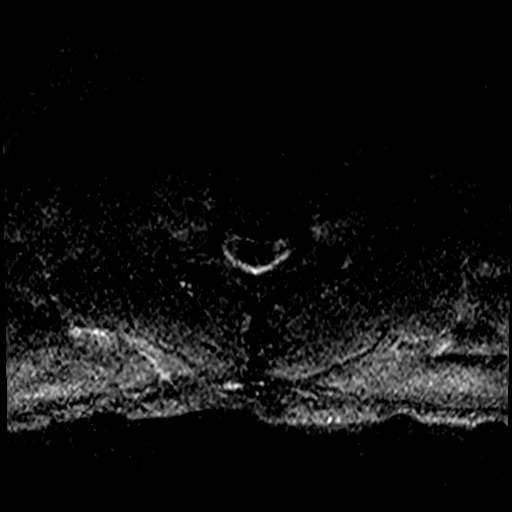
[im 14/30]
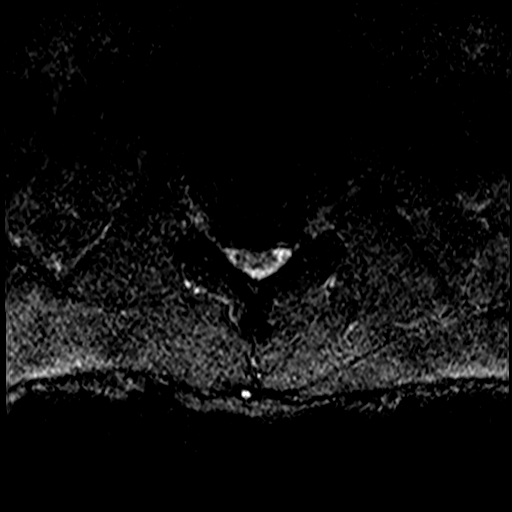
[im 16/30]
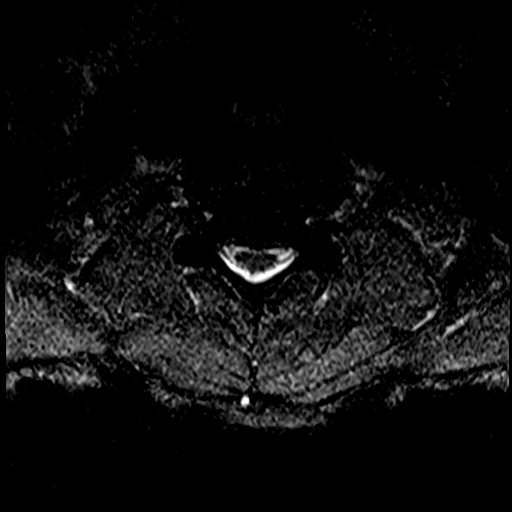

[33 of 48 positions shown; findings below may reference images not displayed]

FINDINGS: MRI HEAD FINDINGS

Brain: Heterogeneous and avidly enhancing mass straddling the
anterior falx at the level of the body and genu of the anterior
corpus callosum measures 6.2 x 4.7 x 5.1 cm in greatest dimensions
(AP by transverse by craniocaudad). Scattered areas of internal
susceptibility artifact compatible with necrosis. Associated
T2/FLAIR hyperintensity throughout the adjacent right frontal lobe
consistent with vasogenic edema and/or nonenhancing infiltrative
tumor. Small satellite nodular focus of enhancement measuring 7 mm
noted just superiorly at the parasagittal left frontal lobe (series
28, image 127). Finding highly concerning for a high-grade glioma.
Secondary mass effect on the adjacent lateral ventricles which are
compressed and effaced anteriorly. No hydrocephalus or ventricular
trapping. No definite ependymal spread of tumor through the
ventricular system. LOCALIZED right-to-left shift measures up to 5
mm.

Otherwise, remainder the brain is normal in appearance. No evidence
for acute or subacute infarct. Gray-white matter differentiation
otherwise maintained. No other areas of acute or chronic
intracranial hemorrhage. No other mass lesion or abnormal
enhancement. No extra-axial fluid collection.

Vascular: Major intracranial vascular flow voids are maintained.
Mass effect on the anterior cerebral arteries which are displaced
slightly to the left and course along the anterior margin and
partially through the midline mass (series 10, images 15, 16).

Skull and upper cervical spine: Craniocervical junction within
normal limits. Bone marrow signal intensity normal. No scalp soft
tissue abnormality.

Sinuses/Orbits: Globes and orbital soft tissues within normal
limits. Paranasal sinuses are largely clear. No mastoid effusion.
Inner ear structures grossly normal.

Other: None.

MRI CERVICAL SPINE FINDINGS

Alignment: Examination moderately degraded by motion artifact.

Straightening of the normal cervical lordosis. Trace anterolisthesis
of C7 on T1, chronic and facet mediated.

Vertebrae: Vertebral body height maintained without evidence for
acute or chronic fracture. Bone marrow signal intensity within
normal limits. No discrete or worrisome osseous lesions. Discogenic
reactive endplate changes with associated marrow edema present about
the T1-2 interspace. No other abnormal marrow edema.

Cord: Signal intensity within the cervical spinal cord grossly
within normal limits.

Posterior Fossa, vertebral arteries, paraspinal tissues:
Craniocervical junction normal. Paraspinous and prevertebral soft
tissues within normal limits. Normal flow voids seen within the
vertebral arteries bilaterally.

Disc levels:

C2-C3: Diffuse disc bulge with bilateral uncovertebral hypertrophy.
Broad posterior disc osteophyte flattens and partially faces the
ventral thecal sac resultant mild spinal stenosis. Moderate right
with mild left C3 foraminal stenosis.

C3-C4: Chronic intervertebral disc space narrowing with diffuse
degenerative disc osteophyte. Broad posterior component flattens and
indents the ventral thecal sac. Secondary mild to moderate spinal
stenosis with mild cord flattening. No cord signal changes. Moderate
bilateral C4 foraminal stenosis, worse on the right.

C4-C5: Mild disc bulge with uncovertebral hypertrophy. Superimposed
bilateral facet degeneration. Mild spinal stenosis without cord
deformity. Moderate bilateral C5 foraminal stenosis.

C5-C6: Chronic intervertebral disc space narrowing with diffuse
degenerative disc osteophyte. Flattening and effacement of the
ventral thecal sac with resultant mild spinal stenosis. No cord
deformity. Moderate left worse than right C6 foraminal narrowing.

C6-C7: Chronic intervertebral disc space narrowing with diffuse disc
osteophyte. No significant spinal stenosis. Severe left with
moderate right C7 foraminal stenosis.

C7-T1: Normal interspace. Moderate bilateral facet hypertrophy. No
spinal stenosis. Mild bilateral C8 foraminal narrowing.

T1-2: Diffuse disc bulge with reactive endplate changes. Moderate
bilateral facet hypertrophy. Mild spinal stenosis without cord
deformity. Severe right with moderate left foraminal narrowing.
IMPRESSION: MRI HEAD IMPRESSION:

1. 6.2 x 4.7 x 5.1 cm enhancing mass centered at the anterior corpus
callosum, most compatible with a high-grade glioma/glioblastoma
multiforme. Associated FLAIR signal intensity throughout the
adjacent right frontal lobe compatible with associated edema and/or
nonenhancing tumor. Associated 5 mm right-to-left midline shift. No
hydrocephalus or ventricular trapping.
2. Associated 7 mm satellite nodular focus of enhancement at the
parasagittal left frontal lobe as above.

MRI CERVICAL SPINE IMPRESSION:

1. No acute abnormality within the cervical spine.
2. Moderate multilevel cervical spondylosis with resultant
mild-to-moderate diffuse spinal stenosis, most pronounced at C3-4.
Moderate to severe multilevel foraminal narrowing as above.

## 2020-03-13 MED ORDER — ACETAMINOPHEN 325 MG PO TABS
650.00 | ORAL_TABLET | ORAL | Status: DC
Start: ? — End: 2020-03-13

## 2020-03-13 MED ORDER — INSULIN REGULAR HUMAN 100 UNIT/ML IJ SOLN
0.00 | INTRAMUSCULAR | Status: DC
Start: 2020-03-13 — End: 2020-03-13

## 2020-03-13 MED ORDER — LEVETIRACETAM IN NACL 1000 MG/100ML IV SOLN
1000.0000 mg | Freq: Once | INTRAVENOUS | Status: AC
  Administered 2020-03-13: 1000 mg via INTRAVENOUS
  Filled 2020-03-13: qty 100

## 2020-03-13 MED ORDER — LEVETIRACETAM 500 MG PO TABS
1000.00 | ORAL_TABLET | ORAL | Status: DC
Start: 2020-03-15 — End: 2020-03-13

## 2020-03-13 MED ORDER — LEVETIRACETAM IN NACL 1500 MG/100ML IV SOLN: 1500.0000 mg | Freq: Once | INTRAVENOUS | Status: DC

## 2020-03-13 MED ORDER — DEXAMETHASONE 4 MG PO TABS
4.00 | ORAL_TABLET | ORAL | Status: DC
Start: 2020-03-13 — End: 2020-03-13

## 2020-03-13 MED ORDER — ALUM & MAG HYDROXIDE-SIMETH 200-200-20 MG/5ML PO SUSP
15.0000 mL | Freq: Once | ORAL | Status: AC
  Administered 2020-03-13: 15 mL via ORAL
  Filled 2020-03-13: qty 30

## 2020-03-13 MED ORDER — SENNOSIDES-DOCUSATE SODIUM 8.6-50 MG PO TABS
1.00 | ORAL_TABLET | ORAL | Status: DC
Start: ? — End: 2020-03-13

## 2020-03-13 MED ORDER — LORAZEPAM 2 MG/ML IJ SOLN
INTRAMUSCULAR | Status: AC
  Administered 2020-03-13: 2 mg via INTRAVENOUS
  Filled 2020-03-13: qty 1

## 2020-03-13 MED ORDER — FAMOTIDINE 20 MG PO TABS
20.0000 mg | ORAL_TABLET | Freq: Once | ORAL | Status: AC
  Administered 2020-03-13: 20 mg via ORAL
  Filled 2020-03-13: qty 1

## 2020-03-13 MED ORDER — LORAZEPAM 2 MG/ML IJ SOLN: 2.0000 mg | Freq: Once | INTRAMUSCULAR | Status: AC

## 2020-03-13 MED ORDER — ONDANSETRON HCL 4 MG/2ML IJ SOLN: 4.0000 mg | Freq: Once | INTRAMUSCULAR | Status: AC

## 2020-03-13 MED ORDER — PANTOPRAZOLE SODIUM 40 MG PO TBEC
40.00 | DELAYED_RELEASE_TABLET | ORAL | Status: DC
Start: 2020-03-16 — End: 2020-03-13

## 2020-03-13 MED ORDER — ONDANSETRON HCL 4 MG/2ML IJ SOLN
INTRAMUSCULAR | Status: AC
  Administered 2020-03-13: 4 mg via INTRAVENOUS
  Filled 2020-03-13: qty 2

## 2020-03-13 MED ORDER — DEXAMETHASONE SODIUM PHOSPHATE 10 MG/ML IJ SOLN
10.0000 mg | Freq: Once | INTRAMUSCULAR | Status: AC
  Administered 2020-03-13: 10 mg via INTRAVENOUS
  Filled 2020-03-13: qty 1

## 2020-03-13 MED ORDER — LIDOCAINE HCL 1 % IJ SOLN
0.50 | INTRAMUSCULAR | Status: DC
Start: ? — End: 2020-03-13

## 2020-03-13 MED ORDER — GLUCAGON (RDNA) 1 MG IJ KIT
1.00 | PACK | INTRAMUSCULAR | Status: DC
Start: ? — End: 2020-03-13

## 2020-03-13 MED ORDER — GADOBUTROL 1 MMOL/ML IV SOLN
7.5000 mL | Freq: Once | INTRAVENOUS | Status: AC | PRN
  Administered 2020-03-13: 7.5 mL via INTRAVENOUS

## 2020-03-13 MED ORDER — DEXTROSE 50 % IV SOLN
12.50 | INTRAVENOUS | Status: DC
Start: ? — End: 2020-03-13

## 2020-03-13 MED ORDER — SODIUM CHLORIDE 0.9 % IV SOLN
INTRAVENOUS | Status: DC
Start: ? — End: 2020-03-13

## 2020-03-13 NOTE — ED Notes (Signed)
emtala reviewed by this rN

## 2020-03-13 NOTE — ED Notes (Signed)
Pt to MRI at this time.

## 2020-03-13 NOTE — ED Provider Notes (Signed)
.  Critical Care Performed by: Carrie Mew, MD Authorized by: Carrie Mew, MD   Critical care provider statement:    Critical care time (minutes):  35   Critical care time was exclusive of:  Separately billable procedures and treating other patients   Critical care was necessary to treat or prevent imminent or life-threatening deterioration of the following conditions:  CNS failure or compromise   Critical care was time spent personally by me on the following activities:  Development of treatment plan with patient or surrogate, discussions with consultants, evaluation of patient's response to treatment, examination of patient, obtaining history from patient or surrogate, ordering and performing treatments and interventions, ordering and review of laboratory studies, ordering and review of radiographic studies, pulse oximetry, re-evaluation of patient's condition and review of old charts    ----------------------------------------- 8:19 AM on 03/13/2020 -----------------------------------------  Patient had recurrent episode of loss of consciousness, characterized by drawing up into a fetal position and turning to his right side, unresponsive for approximately 60 seconds.  Given Ativan 2 mg IV, after which he had vomiting and then return of alertness, asking for a cool rag.  He was turned to the recovery position during the vomiting, suction used to help maintain a clear airway.  Case discussed with [facility] neurosurgery Dr. Francesca Oman, who accepts for transfer to [facility].  Recommends Decadron 10 mg IV, Keppra 1500 mg IV.  We will continue to monitor respirations and mental status.  With quick return to alertness, I doubt a spontaneous hemorrhage or herniation, and he does not require intubation at this time.         Carrie Mew, MD 03/17/20 [phone]

## 2020-03-13 NOTE — ED Notes (Signed)
Pt remains sedate post seizure.  Unable to sign transfer form.  No family present at this time.  Verbally explained transfer to pt with minimal acceptance of understanding.

## 2020-03-13 NOTE — ED Notes (Signed)
Patient to xray.

## 2020-03-13 NOTE — ED Provider Notes (Signed)
[facility] Emergency Department Provider Note ____________________________________________   First MD Initiated Contact with Patient 03/13/20 [phone]     (approximate)  I have reviewed the triage vital signs and the nursing notes.   HISTORY  Chief Complaint Dizziness and Heartburn    HPI Corey Rasmussen is a 59 y.o. male with PMH as noted below who presents with multiple complaints.  1.  Neck pain: The patient has pain in the right side of his neck, worse with turning his head, and radiating down to the right shoulder and arm.  He has tingling that goes down into the right arm.  This has been present for at least 1 to 2 weeks.  He states that he was told it was a strain, but he is worried that it could be a problem with a blood vessel.  He denies any trauma to the neck.  He has no actual weakness, decreased grip strength, or numbness.  2.  Possible syncope: The patient reports several episodes in which he has felt dizzy and lightheaded.  He states that this has been happening for a few weeks, and had one episode in which she "fell out" but did not pass out or lose consciousness.  3.  Heartburn: The patient reports pain that he describes as heartburn which is burning in quality and goes from the mid abdomen up towards his chest.  He states that he just started having it tonight.  He reports that he drinks occasional alcohol, but does not eat a lot of spicy foods and did not eat anything unusual today.  He had one episode of vomiting today after taking BC powder.  He denies any change in his bowel movements.   Past Medical History:  Diagnosis Date  . Diabetes mellitus without complication ([place])   . Hypercholesteremia   . Hypertension     There are no problems to display for this patient.   Past Surgical History:  Procedure Laterality Date  . EYE SURGERY    . FOOT SURGERY    . HAND SURGERY Right   . KNEE SURGERY      Prior to Admission medications     Medication Sig Start Date End Date Taking? Authorizing Provider  amLODipine (NORVASC) 5 MG tablet Take 1 tablet (5 mg total) by mouth daily. 01/19/14   Harden Mo, MD  carisoprodol (SOMA) 350 MG tablet Take 1 tablet (350 mg total) by mouth 3 (three) times daily as needed for muscle spasms. 03/26/16   Schaevitz, Randall An, MD  cyclobenzaprine (FLEXERIL) 5 MG tablet Take 1 tablet (5 mg total) by mouth 3 (three) times daily as needed for muscle spasms. 01/08/14   Carvel Getting, NP  hydrochlorothiazide (HYDRODIURIL) 12.5 MG tablet Take 1 tablet (12.5 mg total) by mouth daily. 01/19/14   Harden Mo, MD  hydrocortisone 1 % ointment Apply 1 application topically 2 (two) times daily. 05/01/15   Paulette Blanch, MD  ibuprofen (ADVIL,MOTRIN) 800 MG tablet Take 1 tablet (800 mg total) by mouth 3 (three) times daily. 10/04/13   Teressa Lower, MD  metFORMIN (GLUCOPHAGE) 500 MG tablet Take 1,000 mg by mouth 2 (two) times daily with a meal.    [provider]  methylPREDNISolone (MEDROL) 4 MG tablet Take 1 tablet (4 mg total) by mouth daily. 03/26/16   Schaevitz, Randall An, MD  naproxen (NAPROSYN) 500 MG tablet Take 1 tablet (500 mg total) by mouth 2 (two) times daily. 01/08/14   Kabbe,  Dionne Bucy, NP    Allergies Patient has no known allergies.  No family history on file.  Social History Social History   Tobacco Use  . Smoking status: Current Every Day Smoker    Packs/day: 0.50    Types: Cigarettes  . Smokeless tobacco: Never Used  Substance Use Topics  . Alcohol use: Never  . Drug use: No    Review of Systems  Constitutional: No fever. Eyes: No redness. ENT: Positive for neck pain. Cardiovascular: Positive for chest pain. Respiratory: Denies shortness of breath. Gastrointestinal: No diarrhea. Genitourinary: Negative for flank pain.  Musculoskeletal: Negative for acute back pain. Skin: Negative for rash. Neurological: Negative for focal weakness or  numbness.   ____________________________________________   PHYSICAL EXAM:  VITAL SIGNS: ED Triage Vitals  Enc Vitals Group     BP 03/13/20 0216 137/81     Pulse Rate 03/13/20 0216 63     Resp 03/13/20 0216 18     Temp 03/13/20 0216 97.7 F (36.5 C)     Temp src --      SpO2 03/13/20 0216 100 %     Weight 03/13/20 0212 210 lb (95.3 kg)     Height 03/13/20 0212 5\' 11"  (1.803 m)     Head Circumference --      Peak Flow --      Pain Score 03/13/20 0212 8     Pain Loc --      Pain Edu? --      Excl. in [place]? --     Constitutional: Alert and oriented. Well appearing and in no acute distress. Eyes: Conjunctivae are normal.  EOMI.  PERRLA. Head: Atraumatic. Nose: No congestion/rhinnorhea. Mouth/Throat: Mucous membranes are moist.   Neck: Normal range of motion. Full range of motion.  Mild right trapezius tenderness.  No midline spinal tenderness. Cardiovascular: Normal rate, regular rhythm. Grossly normal heart sounds.  Good peripheral circulation. Respiratory: Normal respiratory effort.  No retractions. Lungs CTAB. Gastrointestinal: Soft and nontender. No distention.  Genitourinary: No flank tenderness. Musculoskeletal: No lower extremity edema.  Extremities warm and well perfused.  Neurologic:  Normal speech and language.  5/5 motor strength and intact sensation to all extremities.  No pronator drift.  Normal coordination. Skin:  Skin is warm and dry. No rash noted. Psychiatric: Mood and affect are normal. Speech and behavior are normal.  ____________________________________________   LABS (all labs ordered are listed, but only abnormal results are displayed)  Labs Reviewed  CBC - Abnormal; Notable for the following components:      Result Value   WBC 11.4 (*)    All other components within normal limits  COMPREHENSIVE METABOLIC PANEL - Abnormal; Notable for the following components:   Glucose, Bld 216 (*)    AST 14 (*)    All other components within normal limits   URINALYSIS, COMPLETE (UACMP) WITH MICROSCOPIC - Abnormal; Notable for the following components:   Color, Urine YELLOW (*)    APPearance HAZY (*)    Hgb urine dipstick SMALL (*)    All other components within normal limits  LIPASE, BLOOD - Abnormal; Notable for the following components:   Lipase 67 (*)    All other components within normal limits  TROPONIN I (HIGH SENSITIVITY)  TROPONIN I (HIGH SENSITIVITY)   ____________________________________________  EKG  ED ECG REPORT I, Arta Silence, the attending physician, personally viewed and interpreted this ECG.  Date: 03/13/2020 EKG Time: 0211 Rate: 64 Rhythm: normal sinus rhythm QRS Axis: normal Intervals: LAFB  ST/T Wave abnormalities: Nonspecific T wave abnormalities Narrative Interpretation: Nonspecific abnormalities with no evidence of acute ischemia  ____________________________________________  RADIOLOGY  CXR: No focal infiltrate or other acute abnormality MR brain: 5 cm Central mass with 5 mm midline shift and possible edema MR cervical spine: No acute abnormality  ____________________________________________   PROCEDURES  Procedure(s) performed: No  Procedures  Critical Care performed: No ____________________________________________   INITIAL IMPRESSION / ASSESSMENT AND PLAN / ED COURSE  Pertinent labs & imaging results that were available during my care of the patient were reviewed by me and considered in my medical decision making (see chart for details).  59 year old male with PMH as noted above presents with multiple complaints.  Specifically he has had right-sided neck pain over the last 1 to 2 weeks with some tingling going down to the right arm, dizziness with at least one episode of possible near syncope last week, and epigastric and chest pain which has just been tonight.  I reviewed the past medical records in [place].  The patient was most recently seen in the ED on 5/16 with a primary  complaint of lightheadedness and near syncope, as well as back pain.  He had lab work-up and a CT angio of the chest which was negative.  Subsequently the patient had a [facility] family medicine telemetry visit on 5/20 and was prescribed Voltaren gel and tizanidine for the neck pain, and is planned for a TTE for further evaluation of syncope.  On exam today, the patient is well-appearing.  His vital signs are normal.  The physical exam is unremarkable.  Neurologic exam is nonfocal.  His EKG is nonischemic.  1.  Neck pain: This is most consistent with muscle strain/spasm versus possible radicular pain.  The patient has no neurologic deficits or evidence of vascular etiology, but differential does include cervical radiculopathy.  Based on shared decision making with the patient we will obtain an MRI of the cervical spine for further evaluation.  2.  Syncope/near syncope: Cause of this is unclear.  The initial lab work-up today and on the previous visit is unremarkable.  There is no evidence of anemia and I do not suspect cardiac etiology.  Differential could include some type of seizure or other CNS cause.  The patient has not had any CNS imaging, so since we are getting an MR of the cervical spine I will obtain an MR of the brain as well.  If this is negative plan will be for continued outpatient work-up.  3.  Heartburn: The patient reports that this is just tonight.  His pain has improved.  He has no active vomiting.  Lab work-up is significant for slightly elevated lipase.  Differential includes very mild pancreatitis, versus more likely GERD, gastritis, PUD.  We will give Pepcid and Maalox.  ----------------------------------------- 7:28 AM on 03/13/2020 -----------------------------------------  MR of the cervical spine shows no acute findings.  MR brain shows a mass concerning for glioblastoma with some midline shift and possible edema.  On reassessment, the patient remains alert and oriented.  He has  no new symptoms.  He has been sleeping comfortably since the MRI.  I informed him of the results of the imaging and the possibility of malignancy.  I contacted the [facility] transfer center to discuss the case with neurosurgery and determine a disposition.  I am now signing the patient out to the oncoming physician Dr. Joni Fears.  ____________________________________________   FINAL CLINICAL IMPRESSION(S) / ED DIAGNOSES  Final diagnoses:  Brain mass  NEW MEDICATIONS STARTED DURING THIS VISIT:  New Prescriptions   No medications on file     Note:  This document was prepared using Dragon voice recognition software and may include unintentional dictation errors.    Arta Silence, MD 03/13/20 [phone]

## 2020-03-13 NOTE — ED Notes (Signed)
Pt cleaned of incontinent urine, linens changed, pt placed in hospital gown, clothes placed in pt belongings bag.  1 black iphone, 1 set keys and 1 earring placed in bag with clothing.  Pt remains sleepy, but responds to voice.  Will continue to monitor closely.

## 2020-03-13 NOTE — ED Notes (Signed)
Pt reports neck pain 8/10, chronic. Pt states he takes tylenol and BC powder daily. Pt states this morning he "had a reaction to the [facility] and threw up".  Pt states he is experiencing heartburn at this time.  EDP aware.

## 2020-03-13 NOTE — ED Notes (Signed)
Pt's call bell sounded, pt did not respond to Network engineer.  This RN to pt room, pt found with full body tonic activity in prone position on stretcher.  Pt unresponsive to sternal rub, pt diaphoretic and incontinent of urine.  Dr. Joni Fears to bedside, new orders obtained.  Shortly after tonic activity stopped, pt began to vomit moderate amount of reddish brown fluid.  Airway protected, new orders received.  Will continue to monitor.

## 2020-03-13 NOTE — ED Triage Notes (Signed)
Patient c/o dizziness, neck pain, heartburn, N/V. Patient reports waking up in cold sweats. Patient reports symptoms ongoing for a week.

## 2020-03-13 NOTE — ED Notes (Signed)
Patient given update on wait time. Patient verbalizes understanding.  

## 2020-03-17 MED ORDER — MELATONIN 3 MG PO TABS
3.00 | ORAL_TABLET | ORAL | Status: DC
Start: 2020-03-15 — End: 2020-03-17

## 2020-03-17 MED ORDER — INSULIN REGULAR HUMAN 100 UNIT/ML IJ SOLN
0.00 | INTRAMUSCULAR | Status: DC
Start: 2020-03-15 — End: 2020-03-17

## 2020-03-17 MED ORDER — OXYCODONE HCL 5 MG PO TABS
5.00 | ORAL_TABLET | ORAL | Status: DC
Start: ? — End: 2020-03-17

## 2020-03-17 MED ORDER — GENERIC EXTERNAL MEDICATION
2.00 | Status: DC
Start: ? — End: 2020-03-17

## 2020-03-17 MED ORDER — NICOTINE 21 MG/24HR TD PT24
1.00 | MEDICATED_PATCH | TRANSDERMAL | Status: DC
Start: 2020-03-16 — End: 2020-03-17

## 2020-03-31 ENCOUNTER — Telehealth: Payer: Self-pay | Admitting: Internal Medicine

## 2020-03-31 ENCOUNTER — Other Ambulatory Visit: Payer: Self-pay | Admitting: Radiation Therapy

## 2020-03-31 NOTE — Telephone Encounter (Signed)
Pt's sister called me back and confirmed the appt date and time for 6/24 with Dr. Mickeal Skinner.

## 2020-03-31 NOTE — Telephone Encounter (Signed)
A new patient appt has been scheduled for Corey Rasmussen to see Dr. Mickeal Skinner on 6/24 at 11:30am. I cld the pt's sister and left the appt date and time on her voicemail.

## 2020-04-12 ENCOUNTER — Other Ambulatory Visit: Payer: Self-pay | Admitting: *Deleted

## 2020-04-12 ENCOUNTER — Inpatient Hospital Stay: Payer: Medicaid Other | Attending: Internal Medicine

## 2020-04-12 ENCOUNTER — Telehealth: Payer: Self-pay | Admitting: *Deleted

## 2020-04-12 DIAGNOSIS — I10 Essential (primary) hypertension: Secondary | ICD-10-CM | POA: Insufficient documentation

## 2020-04-12 DIAGNOSIS — C711 Malignant neoplasm of frontal lobe: Secondary | ICD-10-CM | POA: Insufficient documentation

## 2020-04-12 DIAGNOSIS — Z79899 Other long term (current) drug therapy: Secondary | ICD-10-CM | POA: Insufficient documentation

## 2020-04-12 DIAGNOSIS — Z7982 Long term (current) use of aspirin: Secondary | ICD-10-CM | POA: Insufficient documentation

## 2020-04-12 DIAGNOSIS — Z794 Long term (current) use of insulin: Secondary | ICD-10-CM | POA: Insufficient documentation

## 2020-04-12 DIAGNOSIS — E7800 Pure hypercholesterolemia, unspecified: Secondary | ICD-10-CM | POA: Insufficient documentation

## 2020-04-12 DIAGNOSIS — E119 Type 2 diabetes mellitus without complications: Secondary | ICD-10-CM | POA: Insufficient documentation

## 2020-04-12 DIAGNOSIS — F17210 Nicotine dependence, cigarettes, uncomplicated: Secondary | ICD-10-CM | POA: Insufficient documentation

## 2020-04-12 NOTE — Telephone Encounter (Signed)
spoke with Sister about all appointment times and gave address.

## 2020-04-14 NOTE — Progress Notes (Signed)
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  Moderate multilevel cervical spondylosis with resultant mild-to-moderate diffuse spinal stenosis, most pronounced at C3-4.  Moderate to severe multilevel foraminal narrowing as above.  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 Associated FLAIR signal intensity throughout the adjacent right frontal lobe compatible with associated edema and/or nonenhancing tumor. Associated 5 mm right-to-left midline shift. No hydrocephalus or ventricular trapping.  Associated 7 mm satellite nodular focus of enhancement at the parasagittal left frontal lobe as above.  Biopsies of Brain 04/07/2020 at [facility]    Past or anticipated interventions, if any, per neurosurgery:  Dr. Francesca Oman -Right frontal craniotomy 2/69/4854 -Post-op complications of left facial droop, left hemiparesis, left side neglect, and dysarthric speech due to small volume hemorrhage along the posterior aspect of the resection cavity.    Past or anticipated interventions, if any, per medical oncology:  Dr. Mickeal Skinner 04/15/2020   Dose of Decadron, if applicable: 2mg  daily  Recent neurologic symptoms, if any:   Seizures: no  Headaches: no  Nausea: No  Dizziness/ataxia: Mp  Difficulty with hand coordination: No   Focal numbness/weakness: Yes, legs   Visual deficits/changes: no Confusion/Memory deficits: Yes, confusion  SAFETY ISSUES:  Prior radiation? No  Pacemaker/ICD? no  Possible current pregnancy? n/a  Is the patient on methotrexate? No  Additional Complaints / other details:

## 2020-04-15 ENCOUNTER — Ambulatory Visit
Admission: RE | Admit: 2020-04-15 | Discharge: 2020-04-15 | Disposition: A | Discharge status: Home / Self Care | Payer: Self-pay | Source: Ambulatory Visit | Attending: Radiation Oncology | Admitting: Radiation Oncology

## 2020-04-15 ENCOUNTER — Other Ambulatory Visit: Payer: Self-pay

## 2020-04-15 ENCOUNTER — Inpatient Hospital Stay (HOSPITAL_BASED_OUTPATIENT_CLINIC_OR_DEPARTMENT_OTHER): Payer: Self-pay | Admitting: Internal Medicine

## 2020-04-15 VITALS — BP 155/81 | HR 97 | Temp 98.1°F | Resp 18 | Ht 71.0 in

## 2020-04-15 DIAGNOSIS — C711 Malignant neoplasm of frontal lobe: Secondary | ICD-10-CM | POA: Insufficient documentation

## 2020-04-15 DIAGNOSIS — Z7982 Long term (current) use of aspirin: Secondary | ICD-10-CM

## 2020-04-15 DIAGNOSIS — I10 Essential (primary) hypertension: Secondary | ICD-10-CM

## 2020-04-15 DIAGNOSIS — F17210 Nicotine dependence, cigarettes, uncomplicated: Secondary | ICD-10-CM

## 2020-04-15 DIAGNOSIS — R42 Dizziness and giddiness: Secondary | ICD-10-CM | POA: Insufficient documentation

## 2020-04-15 DIAGNOSIS — Z794 Long term (current) use of insulin: Secondary | ICD-10-CM

## 2020-04-15 DIAGNOSIS — E7800 Pure hypercholesterolemia, unspecified: Secondary | ICD-10-CM

## 2020-04-15 DIAGNOSIS — C719 Malignant neoplasm of brain, unspecified: Secondary | ICD-10-CM | POA: Insufficient documentation

## 2020-04-15 DIAGNOSIS — Z79899 Other long term (current) drug therapy: Secondary | ICD-10-CM

## 2020-04-15 DIAGNOSIS — R410 Disorientation, unspecified: Secondary | ICD-10-CM | POA: Insufficient documentation

## 2020-04-15 DIAGNOSIS — Z7189 Other specified counseling: Secondary | ICD-10-CM | POA: Insufficient documentation

## 2020-04-15 DIAGNOSIS — E119 Type 2 diabetes mellitus without complications: Secondary | ICD-10-CM | POA: Insufficient documentation

## 2020-04-15 DIAGNOSIS — R569 Unspecified convulsions: Secondary | ICD-10-CM

## 2020-04-15 DIAGNOSIS — M549 Dorsalgia, unspecified: Secondary | ICD-10-CM | POA: Insufficient documentation

## 2020-04-15 MED ORDER — TEMOZOLOMIDE 140 MG PO CAPS: 140.0000 mg | ORAL_CAPSULE | Freq: Every day | ORAL | 0 refills | Status: DC

## 2020-04-15 MED ORDER — ONDANSETRON HCL 8 MG PO TABS: 8.0000 mg | ORAL_TABLET | Freq: Two times a day (BID) | ORAL | 1 refills | Status: AC | PRN

## 2020-04-15 MED FILL — ONDANSETRON HCL 8 MG TABLET: 8 | 15 days supply | Qty: 30 | Fill #0

## 2020-04-15 NOTE — Progress Notes (Signed)
START ON PATHWAY REGIMEN - Neuro     One cycle, concurrent with RT:     Temozolomide   **Always confirm dose/schedule in your pharmacy ordering system**  Patient Characteristics: Glioblastoma (Grade IV Glioma), Newly Diagnosed / Treatment Naive, Good Performance Status and/or Younger Patient, MGMT Promoter Unmethylated/Unknown Disease Classification: Glioma Disease Classification: Glioblastoma (Grade IV Glioma) Disease Status: Newly Diagnosed / Treatment Naive Performance Status: Good Performance Status and/or Younger Patient MGMT Promoter Methylation Status: Awaiting Test Results Intent of Therapy: Non-Curative / Palliative Intent, Discussed with Patient 

## 2020-04-15 NOTE — Progress Notes (Addendum)
Radiation Oncology         [phone]) [phone] ________________________________  Name: Corey Rasmussen        MRN: [id number]  Date of Service: 04/15/2020 DOB: Sep 07, 1961  DT:OIZ[place], [place]*   DIAGNOSIS: The encounter diagnosis was Glioblastoma [facility]).   HISTORY OF PRESENT ILLNESS: Corey Rasmussen is a 59 y.o. male seen at the request of Dr. Mickeal Skinner for a recently diagnosed glioblastoma.  The patient initially presented to [facility] on 03/07/2020 complaining of dizziness and back pain.  Of note he did test positive last year for coronavirus in June 2020.  He had imaging on of the chest abdomen and pelvis to rule out aortic dissection on 03/07/2020 in the ER, no acute findings were identified.  He had degenerative changes of his thoracic spine, and of atherosclerotic disease in multiple vessels.  He returned to the emergency room on 03/13/2020 complaining of dizziness neck pain and nausea and vomiting, waking up and cold sweats that have been going on for 1 week.  MRI of the brain revealed a 6.2 x 4.7 x 5.1 cm enhancing mass centered in the anterior corpus callosum concerning for high-grade glioma with associated 5 mm right to left midline shift.  In a satellite 7 mm nodule of enhancement at the parasagittal left frontal lobe was also noted.  MRI of the cervical spine without contrast did not reveal any acute findings.  Apparently he had a seizure while in the ER.  Because of this he was transferred to [facility], and on 03/18/2020 underwent craniotomy with resection of brain tumor by Dr. Brett Albino. Final pathology revealed IDH wild-type Glioblastoma.  He did have postoperative MRI imaging on 04/07/2020 revealing that showed postop right sided craniectomy and resection of the tumor with large resection cavity and surrounding edema areas of enhancement were noted for concerning residual disease.  There was also  possible infarction along the right cerebral peduncle.  He has met with Dr. Mickeal Skinner and his case has been discussed in multidisciplinary brain oncology conference, and recommendations have been to proceed with chemoradiation.  He is seen today to discuss this.   PREVIOUS RADIATION THERAPY: No   PAST MEDICAL HISTORY:  Past Medical History:  Diagnosis Date  . Diabetes mellitus without complication ([place])   . Hypercholesteremia   . Hypertension        PAST SURGICAL HISTORY: Past Surgical History:  Procedure Laterality Date  . EYE SURGERY    . FOOT SURGERY    . HAND SURGERY Right   . KNEE SURGERY       FAMILY HISTORY: No family history on file.   SOCIAL HISTORY:  reports that he has been smoking cigarettes. He has been smoking about 0.50 packs per day. He has never used smokeless tobacco. He reports that he does not drink alcohol and does not use drugs.  The patient is single.  He lives in [place].  He is accompanied by a friend his sister and uncle.  ALLERGIES: Patient has no known allergies.   MEDICATIONS:  Current Outpatient Medications  Medication Sig Dispense Refill  . acetaminophen (TYLENOL) 325 MG tablet Take by mouth.    Marland Kitchen aspirin 81 MG EC tablet Take by mouth.    Marland Kitchen atorvastatin (LIPITOR) 10 MG tablet Take by mouth.    . cefUROXime (CEFTIN) 500 MG tablet Take by mouth.    . Cysteamine Bitartrate (PROCYSBI) 300  MG PACK Use 1 each 4 (four) times daily Use as instructed.    Marland Kitchen dexamethasone (DECADRON) 2 MG tablet Take by mouth.    . fludrocortisone (FLORINEF) 0.1 MG tablet Take by mouth.    Marland Kitchen glucose blood (PRECISION QID TEST) test strip 1 each (1 strip total) by XX route 4 (four) times daily Use as instructed.    . insulin regular (NOVOLIN R) 100 units/mL injection Inject into the skin.    Marland Kitchen levETIRAcetam (KEPPRA) 1000 MG tablet Take by mouth.    . magnesium hydroxide (MILK OF MAGNESIA) 400 MG/5ML suspension Take by mouth.    . melatonin 3 MG TABS tablet 1 tablet (3  mg total) by Via Tube route nightly    . nicotine (NICODERM CQ - DOSED IN MG/24 HOURS) 14 mg/24hr patch Place onto the skin.    Marland Kitchen ondansetron (ZOFRAN) 4 MG tablet Take by mouth.    . pantoprazole (PROTONIX) 40 MG tablet Take by mouth.    . polyethylene glycol powder (GLYCOLAX/MIRALAX) 17 GM/SCOOP powder Take by mouth.    . potassium chloride SA (KLOR-CON) 20 MEQ tablet Take by mouth.    . senna-docusate (SENOKOT-S) 8.6-50 MG tablet Take by mouth.     No current facility-administered medications for this encounter.     REVIEW OF SYSTEMS: On review of systems, the patient reports that he is doing well overall given all of what has taken place in the last month and a half.  He has has not had any additional seizures, he reports that his headaches have resolved and he is no longer nauseated.  He does have some weakness in his legs bilaterally, and at times his family members have noticed confusion.  No other complaints are verbalized.     PHYSICAL EXAM:  Wt Readings from Last 3 Encounters:  03/13/20 210 lb (95.3 kg)  03/07/20 213 lb (96.6 kg)  03/31/19 222 lb (100.7 kg)   Temp Readings from Last 3 Encounters:  04/15/20 98.1 F (36.7 C)  04/15/20 98.1 F (36.7 C) (Temporal)  03/13/20 98 F (36.7 C)   BP Readings from Last 3 Encounters:  04/15/20 (!) 155/81  04/15/20 (!) 155/81  03/13/20 134/86   Pulse Readings from Last 3 Encounters:  04/15/20 97  04/15/20 97  03/13/20 60    /10  In general this is a well appearing African-American male in a wheelchair in no acute distress.  He's alert and oriented x4 and appropriate throughout the examination. Cardiopulmonary assessment is negative for acute distress and he exhibits normal effort. He falls asleep during the conversation but rouses.      ECOG = 2  0 - Asymptomatic (Fully active, able to carry on all predisease activities without restriction)  1 - Symptomatic but completely ambulatory (Restricted in physically strenuous  activity but ambulatory and able to carry out work of a light or sedentary nature. For example, light housework, office work)  2 - Symptomatic, <50% in bed during the day (Ambulatory and capable of all self care but unable to carry out any work activities. Up and about more than 50% of waking hours)  3 - Symptomatic, >50% in bed, but not bedbound (Capable of only limited self-care, confined to bed or chair 50% or more of waking hours)  4 - Bedbound (Completely disabled. Cannot carry on any self-care. Totally confined to bed or chair)  5 - Death   Eustace Pen MM, Creech RH, Tormey DC, et al. [phone]). "Toxicity and response criteria of the [place]  Cooperative Oncology Group". [place] Oncol. 5 (6): 649-55    LABORATORY DATA:  Lab Results  Component Value Date   WBC 11.4 (H) 03/13/2020   HGB 15.7 03/13/2020   HCT 44.2 03/13/2020   MCV 89.5 03/13/2020   PLT 276 03/13/2020   Lab Results  Component Value Date   NA 137 03/13/2020   K 3.9 03/13/2020   CL 102 03/13/2020   CO2 27 03/13/2020   Lab Results  Component Value Date   ALT 9 03/13/2020   AST 14 (L) 03/13/2020   ALKPHOS 56 03/13/2020   BILITOT 0.6 03/13/2020      RADIOGRAPHY: No results found.     IMPRESSION/PLAN: 1. Glioblastoma involving the anterior falx at the level of the body and genu of the anterior corpus callosum. Dr. Lisbeth Renshaw discusses the pathology findings and reviews the nature of primary brain malignancy. We discussed the rationale to proceed with chemoradiation. The patient is doing well postoperatively and has met with Dr. Mickeal Skinner. We discussed the risks, benefits, short, and long term effects of radiotherapy, and the patient is interested in proceeding. Dr. Lisbeth Renshaw discusses the delivery and logistics of radiotherapy and anticipates a course of 6 weeks of radiotherapy. Written consent is obtained and placed in the chart, a copy was provided to the patient. He will simulate following today's visit.  In a visit lasting  60 minutes, greater than 50% of the time was spent face to face discussing the patient's condition, in preparation for the discussion, and coordinating the patient's care.   The above documentation reflects my direct findings during this shared patient visit. Please see the separate note by Dr. Lisbeth Renshaw on this date for the remainder of the patient's plan of care.    Carola Rhine, PAC

## 2020-04-15 NOTE — Progress Notes (Signed)
Corey Rasmussen at [place], [place] [address] [phone]   New Patient Evaluation  Date of Service: 04/15/20 Patient Name: Corey Rasmussen Patient MRN: [id number] Patient DOB: 1961-07-24 Provider: Ventura Sellers, MD  Identifying Statement:  [name] is a 59 y.o. male with right frontal glioblastoma who presents for initial consultation and evaluation.    Referring Provider: [facility], [address],  [place] [address]  Oncologic History: Oncology History  Glioblastoma ([place])  03/18/2020 Surgery   Craniotomy, resection by Dr. Brett Albino at [facility].  Path demonstrates glioblastoma     Biomarkers:  MGMT Unknown.  IDH 1/2 Wild type.  EGFR Unknown  TERT "Mutated   History of Present Illness: The patient's records from the referring physician were obtained and reviewed and the patient interviewed to confirm this HPI.  Corey Rasmussen presents today to for new evaluation for glioblastoma.  He presented to medical attention in May 2021 after several episodes of "blacking out, loss of consciousness" while at home.  CT head was obtained which demonstrated large enhancing mass.  He was referred to [facility] for surgery, and underwent resection by Dr. Brett Albino on 03/18/20.  Following surgery he developed left sided weakness, for which he remained in rehab facility for ~10 days prior to discharge to home.  Currently he is at home with family members for help.  He is able to ambulate with the help of a four pointed walker due to left leg weakness, although at times he requires a wheelchair.  His left arm is stronger now and able to perform basic tasks.  No recurrence of seizures, and no headaches, remains on Keppra 10102m BID.  Currently dosing decadron at 236mBID, he has been on decadron since undergoing surgery 1 month ago.     Medications: Current Outpatient Medications on File Prior to Visit  Medication  Sig Dispense Refill  . acetaminophen (TYLENOL) 325 MG tablet Take by mouth.    . Marland Kitchenspirin 81 MG EC tablet Take by mouth.    . Marland Kitchentorvastatin (LIPITOR) 10 MG tablet Take by mouth.    . cefUROXime (CEFTIN) 500 MG tablet Take by mouth.    . Cysteamine Bitartrate (PROCYSBI) 300 MG PACK Use 1 each 4 (four) times daily Use as instructed.    . Marland Kitchenexamethasone (DECADRON) 2 MG tablet Take by mouth.    . fludrocortisone (FLORINEF) 0.1 MG tablet Take by mouth.    . Marland Kitchenlucose blood (PRECISION QID TEST) test strip 1 each (1 strip total) by XX route 4 (four) times daily Use as instructed.    . insulin regular (NOVOLIN R) 100 units/mL injection Inject into the skin.    . Marland KitchenevETIRAcetam (KEPPRA) 1000 MG tablet Take by mouth.    . magnesium hydroxide (MILK OF MAGNESIA) 400 MG/5ML suspension Take by mouth.    . melatonin 3 MG TABS tablet 1 tablet (3 mg total) by Via Tube route nightly    . nicotine (NICODERM CQ - DOSED IN MG/24 HOURS) 14 mg/24hr patch Place onto the skin.    . Marland Kitchenndansetron (ZOFRAN) 4 MG tablet Take by mouth.    . pantoprazole (PROTONIX) 40 MG tablet Take by mouth.    . polyethylene glycol powder (GLYCOLAX/MIRALAX) 17 GM/SCOOP powder Take by mouth.    . potassium chloride SA (KLOR-CON) 20 MEQ tablet Take by mouth.    . senna-docusate (SENOKOT-S) 8.6-50 MG tablet Take by mouth.  No current facility-administered medications on file prior to visit.    Allergies: No Known Allergies Past Medical History:  Past Medical History:  Diagnosis Date  . Diabetes mellitus without complication ([place])   . Hypercholesteremia   . Hypertension    Past Surgical History:  Past Surgical History:  Procedure Laterality Date  . EYE SURGERY    . FOOT SURGERY    . HAND SURGERY Right   . KNEE SURGERY     Social History:  Social History   Socioeconomic History  . Marital status: Single    Spouse name: Not on file  . Number of children: Not on file  . Years of education: Not on file  . Highest education  level: Not on file  Occupational History  . Not on file  Tobacco Use  . Smoking status: Current Every Day Smoker    Packs/day: 0.50    Types: Cigarettes  . Smokeless tobacco: Never Used  Vaping Use  . Vaping Use: Never used  Substance and Sexual Activity  . Alcohol use: Never  . Drug use: No  . Sexual activity: Yes    Birth control/protection: None  Other Topics Concern  . Not on file  Social History Narrative  . Not on file   Social Determinants of Health   Financial Resource Strain:   . Difficulty of Paying Living Expenses:   Food Insecurity:   . Worried About Charity fundraiser in the Last Year:   . Arboriculturist in the Last Year:   Transportation Needs:   . Film/video editor (Medical):   Marland Kitchen Lack of Transportation (Non-Medical):   Physical Activity:   . Days of Exercise per Week:   . Minutes of Exercise per Session:   Stress:   . Feeling of Stress :   Social Connections:   . Frequency of Communication with Friends and Family:   . Frequency of Social Gatherings with Friends and Family:   . Attends Religious Services:   . Active Member of Clubs or Organizations:   . Attends Archivist Meetings:   Marland Kitchen Marital Status:   Intimate Partner Violence:   . Fear of Current or Ex-Partner:   . Emotionally Abused:   Marland Kitchen Physically Abused:   . Sexually Abused:    Family History: No family history on file.  Review of Systems: Constitutional: Doesn't report fevers, chills or abnormal weight loss Eyes: Doesn't report blurriness of vision Ears, nose, mouth, throat, and face: Doesn't report sore throat Respiratory: Doesn't report cough, dyspnea or wheezes Cardiovascular: Doesn't report palpitation, chest discomfort  Gastrointestinal:  Doesn't report nausea, constipation, diarrhea GU: Doesn't report incontinence Skin: Doesn't report skin rashes Neurological: Per HPI Musculoskeletal: Doesn't report joint pain Behavioral/Psych: ++anxiety  Physical  Exam: Vitals:   04/15/20 1217  BP: (!) 155/81  Pulse: 97  Resp: 18  Temp: 98.1 F (36.7 C)  SpO2: 99%   KPS: 60. General: Alert, cooperative, pleasant, in no acute distress Head: Normal EENT: No conjunctival injection or scleral icterus.  Lungs: Resp effort normal Cardiac: Regular rate Abdomen: Non-distended abdomen Skin: No rashes cyanosis or petechiae. Extremities: No clubbing or edema  Neurologic Exam: Mental Status: Awake, alert, attentive to examiner. Oriented to self and environment. Language is fluent with intact comprehension. Emotional lability noted. Cranial Nerves: Visual acuity is grossly normal. Visual fields are full. Extra-ocular movements intact. No ptosis. Face is symmetric Motor: Tone and bulk are normal. Power is 4+/5 in left arm with impaired fine  motor function.  Left leg is 3/5. Reflexes are symmetric, no pathologic reflexes present.  Sensory: Intact to light touch Gait: Non-ambulatory   Labs: I have reviewed the data as listed    Component Value Date/Time   NA 137 03/13/2020 0217   K 3.9 03/13/2020 0217   CL 102 03/13/2020 0217   CO2 27 03/13/2020 0217   GLUCOSE 216 (H) 03/13/2020 0217   BUN 16 03/13/2020 0217   CREATININE 1.18 03/13/2020 0217   CALCIUM 9.7 03/13/2020 0217   PROT 7.8 03/13/2020 0217   ALBUMIN 4.0 03/13/2020 0217   AST 14 (L) 03/13/2020 0217   ALT 9 03/13/2020 0217   ALKPHOS 56 03/13/2020 0217   BILITOT 0.6 03/13/2020 0217   GFRNONAA >60 03/13/2020 0217   GFRAA >60 03/13/2020 0217   Lab Results  Component Value Date   WBC 11.4 (H) 03/13/2020   NEUTROABS 5.4 10/23/2018   HGB 15.7 03/13/2020   HCT 44.2 03/13/2020   MCV 89.5 03/13/2020   PLT 276 03/13/2020    Pathology:   Assessment/Plan Glioblastoma, IDH-wildtype ([place]) [C71.9]  Rainen C Bevis presents today with clinical syndrome consistent with right greater than left mesial frontal glioblastoma, IDH-wildtype.  Post-operative imaging is still pending receipt,  although impression for radiology reports from [place] suggests gross total resection was accomplished.    His deficits today include motor dysfunction primarily affecting the left leg, and mood lability, unsurprising given location of tumor, concurrent dosing of decadron and Keppra.   We discussed his clinical course, histology, tumor genetics, imaging and prognosis.  We also extensively reviewed goals of care.  He understands that available treatments are not considered curative.    We ultimately recommended proceeding with course of intensity modulated radiation therapy and concurrent daily Temozolomide.  Radiation will be administered Mon-Fri over 6 weeks, Temodar will be dosed at 16m/m2 to be given daily over 42 days.  We reviewed side effects of temodar, including fatigue, nausea/vomiting, constipation, and cytopenias.  Chemotherapy should be held for the following:  ANC less than 1,000  Platelets less than 100,000  LFT or creatinine greater than 2x ULN  If clinical concerns/contraindications develop  Every 2 weeks during radiation, labs will be checked accompanied by a clinical evaluation in the brain tumor clinic.  We recommended decreasing decadron to 234mdaily in the interim, and he will continue Keppra 100059mID.  Screening for potential clinical trials was performed and discussed using eligibility criteria for active protocols at [facility], as well as national database available on [employer].com  The patient is not a candidate for a research protocol at this time due to poor functional status.   We spent twenty additional minutes teaching regarding the natural history, biology, and historical experience in the treatment of brain tumors. We then discussed in detail the current recommendations for therapy focusing on the mode of administration, mechanism of action, anticipated toxicities, and quality of life issues associated with this  plan. We also provided teaching sheets for the patient to take home as an additional resource.  We appreciate the opportunity to participate in the care of [place] questions were answered. The patient knows to call the clinic with any problems, questions or concerns. No barriers to learning were detected.  The total time spent in the encounter was 60 minutes and more than 50% was on counseling and review of test results   ZacVentura SellersD Medical Director of Neuro-Oncology [facility] 04/15/20 3:59 PM

## 2020-04-16 ENCOUNTER — Other Ambulatory Visit: Payer: Self-pay | Admitting: *Deleted

## 2020-04-16 ENCOUNTER — Telehealth: Payer: Self-pay | Admitting: Pharmacist

## 2020-04-16 NOTE — Telephone Encounter (Signed)
Oral Oncology Pharmacist Encounter  Received new prescription for Temodar (temozolomide) for the treatment of glioblastoma in conjunction with XRT, planned duration .until the end of radiation treatment.  Prescription dose and frequency assessed.   Current medication list in Epic reviewed, no DDIs with temozolomide identified.  Prescription has been e-scribed to the [place] patient is uninsured and will need [place] funding to fill the medication.  Oral Oncology Clinic will continue to follow.  Darl Pikes, PharmD, BCPS, BCOP, CPP Hematology/Oncology Clinical Pharmacist Practitioner [facility] [phone]  04/16/2020 10:27 AM

## 2020-04-19 ENCOUNTER — Telehealth: Payer: Self-pay | Admitting: *Deleted

## 2020-04-19 ENCOUNTER — Telehealth: Payer: Self-pay | Admitting: Internal Medicine

## 2020-04-19 ENCOUNTER — Encounter: Payer: Self-pay | Admitting: Radiation Oncology

## 2020-04-19 NOTE — Telephone Encounter (Signed)
Scheduled per 6/28 sch message. Unable to reach pt. Left voicemail with appt times and dates.

## 2020-04-19 NOTE — Progress Notes (Signed)
Called pt and left a detailed voicemail about the Corey Rasmussen and what information the patient would need to bring in to get started.  Left my name and number for patient to call back with any questions.

## 2020-04-19 NOTE — Telephone Encounter (Signed)
Scheduling message sent. 

## 2020-04-23 ENCOUNTER — Telehealth: Payer: Self-pay | Admitting: *Deleted

## 2020-04-23 DIAGNOSIS — C719 Malignant neoplasm of brain, unspecified: Secondary | ICD-10-CM | POA: Insufficient documentation

## 2020-04-23 NOTE — Telephone Encounter (Signed)
Received vm message from pt's sister, Cosina. She states pt has been approved for Medicaid. She states they are in need of incontinent supplies-adults diapers, pads, wipes etc.  She stated the name of the company that supplies this but it was difficult to understand.  She requested call back. Attempted call but no answer but was able to leave vm message for her to call back 2 [phone] and to ask for Dr. Renda Rolls nurse.

## 2020-04-27 ENCOUNTER — Ambulatory Visit
Admission: RE | Admit: 2020-04-27 | Discharge: 2020-04-27 | Disposition: A | Discharge status: Home / Self Care | Payer: Self-pay | Source: Ambulatory Visit | Attending: Radiation Oncology | Admitting: Radiation Oncology

## 2020-04-27 ENCOUNTER — Other Ambulatory Visit: Payer: Self-pay

## 2020-04-27 ENCOUNTER — Telehealth: Payer: Self-pay | Admitting: *Deleted

## 2020-04-27 NOTE — Telephone Encounter (Signed)
I searched for patient eligibility in [place] Track. He is eligible for Medicaid, but only family planning which does not cover any drugs.   Thanks. [place] Patient [place] Phone [phone] Fax [phone] 04/27/2020 3:39 PM

## 2020-04-27 NOTE — Telephone Encounter (Signed)
Patients sister called to advise that patient got approved for medicaid.  He will bring his new card in with him today for his visit.   Routed to [employer], Oral Pharmacist to see about billing his Temodar through [place] instead of [employer].

## 2020-04-28 ENCOUNTER — Ambulatory Visit
Admission: RE | Admit: 2020-04-28 | Discharge: 2020-04-28 | Disposition: A | Discharge status: Home / Self Care | Payer: Self-pay | Source: Ambulatory Visit | Attending: Radiation Oncology | Admitting: Radiation Oncology

## 2020-04-28 ENCOUNTER — Other Ambulatory Visit: Payer: Self-pay

## 2020-04-28 ENCOUNTER — Ambulatory Visit: Payer: Self-pay

## 2020-04-28 NOTE — Telephone Encounter (Addendum)
Oral Chemotherapy Pharmacist Encounter   Patient's sister called the cancer center to report that Corey Rasmussen had Medicaid. Corey Rasmussen, patient advocate, rechecked his status in [facility] Tracks and he still only has Medicaid family planning. For medication like his Temodar, he still needs to be signed up for the [place] in order to get his Temodar. Corey Rasmussen documented this in the encounter stating the patient now had Medicaid.  I called Corey Rasmussen, Radiation Admin support, on 04/27/20 and LVM letting her know the patient still needed to be signed up for the [place] if he qualified. They had previously tried to contact the patient on 04/19/20 about the fund. Additionally, I called the patient's sister Corey Rasmussen to let her know the family planning Medicaid would not cover the Temodar and they should ask about the [place] at his next XRT appt.   Once the [facility] is obtained, we can process and fill the prescription at [place].  Corey Rasmussen, PharmD, BCPS, BCOP, CPP Hematology/Oncology Clinical Pharmacist [facility] [phone]  04/28/2020 4:19 PM

## 2020-04-29 ENCOUNTER — Ambulatory Visit
Admission: RE | Admit: 2020-04-29 | Discharge: 2020-04-29 | Disposition: A | Discharge status: Home / Self Care | Payer: Self-pay | Source: Ambulatory Visit | Attending: Radiation Oncology | Admitting: Radiation Oncology

## 2020-04-29 ENCOUNTER — Other Ambulatory Visit: Payer: Self-pay

## 2020-04-29 ENCOUNTER — Encounter: Payer: Self-pay | Admitting: Radiation Oncology

## 2020-04-29 MED FILL — TEMOZOLOMIDE 140 MG CAPS: 140 | 42 days supply | Qty: 42 | Fill #0

## 2020-04-29 NOTE — Telephone Encounter (Signed)
Oral Chemotherapy Pharmacist Encounter   Received a call from Nance Pew letting me know Mr. Corum was signed up for the [employer].  Darl Pikes, PharmD, BCPS, BCOP, CPP Hematology/Oncology Clinical Pharmacist [facility] [phone]  04/29/2020 2:36 PM

## 2020-04-29 NOTE — Progress Notes (Signed)
Patient signed up for Corey Rasmussen, informed [employer] so that patient can get assistance with Temodar.

## 2020-04-29 NOTE — Progress Notes (Signed)
Reached out to patient again about the [employer].  Patient stated he mostly needed help with supplies such as adult diapers.   I explained to the patient that we offered visa cards that could be used for those and informed him if he was interested to stop by and talk with either me or Ailene Ravel and told him the information he would need to bring with him.  Patient stated he would try.

## 2020-04-29 NOTE — Telephone Encounter (Signed)
Oral Chemotherapy Pharmacist Encounter  Patient's sister Clarencesina plans on picking up the medication from [facility] tomorrow 04/30/20.  Patient Education I spoke with patient's sister Clarencesina for overview of new oral chemotherapy medication: Temodar (temozolomide) for the treatment of glioblastoma in conjunction with XRT, planned duration .until the end of radiation treatment.   Counseled Clarencesina on administration, dosing, side effects, monitoring, drug-food interactions, safe handling, storage, and disposal. Patient will take 1 capsule (140 mg total) by mouth daily. May take on an empty stomach to decrease nausea & vomiting.  Side effects include but not limited to: N/V, decreased wbc, constipation, fatigue.    Reviewed with Clarencesina importance of keeping a medication schedule and plan for any missed doses.  Clarencesina  voiced understanding and appreciation. All questions answered.Medication handout placed in the mail.  Provided Clarencesina with [facility] phone number. Clarencesina knows to call the office with questions or concerns. [facility] will continue to follow.  Darl Pikes, PharmD, BCPS, BCOP, CPP Hematology/Oncology Clinical Pharmacist Practitioner [facility] [phone]  04/29/2020 4:35 PM

## 2020-04-30 ENCOUNTER — Ambulatory Visit
Admission: RE | Admit: 2020-04-30 | Discharge: 2020-04-30 | Disposition: A | Discharge status: Home / Self Care | Payer: Self-pay | Source: Ambulatory Visit | Attending: Radiation Oncology | Admitting: Radiation Oncology

## 2020-04-30 ENCOUNTER — Other Ambulatory Visit: Payer: Self-pay

## 2020-04-30 DIAGNOSIS — C719 Malignant neoplasm of brain, unspecified: Secondary | ICD-10-CM

## 2020-04-30 MED ORDER — SONAFINE EX EMUL
1.0000 "application " | Freq: Once | CUTANEOUS | Status: AC
  Administered 2020-04-30: 1 via TOPICAL

## 2020-05-02 ENCOUNTER — Ambulatory Visit: Admission: RE | Admit: 2020-05-02 | Payer: Self-pay | Source: Ambulatory Visit

## 2020-05-03 ENCOUNTER — Ambulatory Visit
Admission: RE | Admit: 2020-05-03 | Discharge: 2020-05-03 | Disposition: A | Discharge status: Home / Self Care | Payer: Self-pay | Source: Ambulatory Visit | Attending: Radiation Oncology | Admitting: Radiation Oncology

## 2020-05-03 ENCOUNTER — Other Ambulatory Visit: Payer: Self-pay

## 2020-05-04 ENCOUNTER — Other Ambulatory Visit: Payer: Self-pay

## 2020-05-04 ENCOUNTER — Ambulatory Visit
Admission: RE | Admit: 2020-05-04 | Discharge: 2020-05-04 | Disposition: A | Discharge status: Home / Self Care | Payer: Self-pay | Source: Ambulatory Visit | Attending: Radiation Oncology | Admitting: Radiation Oncology

## 2020-05-05 ENCOUNTER — Ambulatory Visit
Admission: RE | Admit: 2020-05-05 | Discharge: 2020-05-05 | Disposition: A | Discharge status: Home / Self Care | Payer: Self-pay | Source: Ambulatory Visit | Attending: Radiation Oncology | Admitting: Radiation Oncology

## 2020-05-05 ENCOUNTER — Other Ambulatory Visit: Payer: Self-pay

## 2020-05-06 ENCOUNTER — Other Ambulatory Visit: Payer: Self-pay

## 2020-05-06 ENCOUNTER — Ambulatory Visit
Admission: RE | Admit: 2020-05-06 | Discharge: 2020-05-06 | Disposition: A | Discharge status: Home / Self Care | Payer: Self-pay | Source: Ambulatory Visit | Attending: Radiation Oncology | Admitting: Radiation Oncology

## 2020-05-07 ENCOUNTER — Ambulatory Visit
Admission: RE | Admit: 2020-05-07 | Discharge: 2020-05-07 | Disposition: A | Discharge status: Home / Self Care | Payer: Self-pay | Source: Ambulatory Visit | Attending: Radiation Oncology | Admitting: Radiation Oncology

## 2020-05-10 ENCOUNTER — Ambulatory Visit
Admission: RE | Admit: 2020-05-10 | Discharge: 2020-05-10 | Disposition: A | Discharge status: Home / Self Care | Payer: Self-pay | Source: Ambulatory Visit | Attending: Radiation Oncology | Admitting: Radiation Oncology

## 2020-05-11 ENCOUNTER — Inpatient Hospital Stay: Payer: Self-pay

## 2020-05-11 ENCOUNTER — Inpatient Hospital Stay: Payer: Self-pay | Attending: Internal Medicine | Admitting: Internal Medicine

## 2020-05-11 ENCOUNTER — Ambulatory Visit (HOSPITAL_COMMUNITY)
Admission: RE | Admit: 2020-05-11 | Discharge: 2020-05-11 | Disposition: A | Discharge status: Home / Self Care | Payer: Medicaid Other | Source: Ambulatory Visit | Attending: Internal Medicine | Admitting: Internal Medicine

## 2020-05-11 ENCOUNTER — Other Ambulatory Visit: Payer: Self-pay

## 2020-05-11 ENCOUNTER — Ambulatory Visit
Admission: RE | Admit: 2020-05-11 | Discharge: 2020-05-11 | Disposition: A | Discharge status: Home / Self Care | Payer: Self-pay | Source: Ambulatory Visit | Attending: Radiation Oncology | Admitting: Radiation Oncology

## 2020-05-11 VITALS — BP 166/106 | HR 72 | Temp 98.2°F | Resp 18

## 2020-05-11 DIAGNOSIS — M25552 Pain in left hip: Secondary | ICD-10-CM

## 2020-05-11 DIAGNOSIS — E7800 Pure hypercholesterolemia, unspecified: Secondary | ICD-10-CM | POA: Insufficient documentation

## 2020-05-11 DIAGNOSIS — F17210 Nicotine dependence, cigarettes, uncomplicated: Secondary | ICD-10-CM | POA: Insufficient documentation

## 2020-05-11 DIAGNOSIS — C719 Malignant neoplasm of brain, unspecified: Secondary | ICD-10-CM

## 2020-05-11 DIAGNOSIS — E119 Type 2 diabetes mellitus without complications: Secondary | ICD-10-CM | POA: Insufficient documentation

## 2020-05-11 DIAGNOSIS — Z794 Long term (current) use of insulin: Secondary | ICD-10-CM | POA: Insufficient documentation

## 2020-05-11 DIAGNOSIS — C711 Malignant neoplasm of frontal lobe: Secondary | ICD-10-CM | POA: Insufficient documentation

## 2020-05-11 DIAGNOSIS — Z7982 Long term (current) use of aspirin: Secondary | ICD-10-CM | POA: Insufficient documentation

## 2020-05-11 DIAGNOSIS — Z79899 Other long term (current) drug therapy: Secondary | ICD-10-CM | POA: Insufficient documentation

## 2020-05-11 DIAGNOSIS — Z7952 Long term (current) use of systemic steroids: Secondary | ICD-10-CM | POA: Insufficient documentation

## 2020-05-11 DIAGNOSIS — I10 Essential (primary) hypertension: Secondary | ICD-10-CM | POA: Insufficient documentation

## 2020-05-11 LAB — CMP (CANCER CENTER ONLY)
ALT: 12 U/L (ref 0–44)
AST: 12 U/L — ABNORMAL LOW (ref 15–41)
Albumin: 3.3 g/dL — ABNORMAL LOW (ref 3.5–5.0)
Alkaline Phosphatase: 75 U/L (ref 38–126)
Anion gap: 12 (ref 5–15)
BUN: 16 mg/dL (ref 6–20)
CO2: 24 mmol/L (ref 22–32)
Calcium: 10.4 mg/dL — ABNORMAL HIGH (ref 8.9–10.3)
Chloride: 105 mmol/L (ref 98–111)
Creatinine: 1.29 mg/dL — ABNORMAL HIGH (ref 0.61–1.24)
GFR, Est AFR Am: >60 mL/min (ref >60)
GFR, Estimated: 60 mL/min — ABNORMAL LOW (ref >60)
Glucose, Bld: 172 mg/dL — ABNORMAL HIGH (ref 70–99)
Potassium: 3.8 mmol/L (ref 3.5–5.1)
Sodium: 141 mmol/L (ref 135–145)
Total Bilirubin: 0.4 mg/dL (ref 0.3–1.2)
Total Protein: 7.3 g/dL (ref 6.5–8.1)

## 2020-05-11 LAB — CBC WITH DIFFERENTIAL (CANCER CENTER ONLY)
Abs Immature Granulocytes: 0.08 10*3/uL — ABNORMAL HIGH (ref 0.00–0.07)
Basophils Absolute: 0 10*3/uL (ref 0.0–0.1)
Basophils Relative: 0 %
Eosinophils Absolute: 0 10*3/uL (ref 0.0–0.5)
Eosinophils Relative: 0 %
HCT: 40.9 % (ref 39.0–52.0)
Hemoglobin: 13.7 g/dL (ref 13.0–17.0)
Immature Granulocytes: 1 %
Lymphocytes Relative: 12 %
Lymphs Abs: 1.3 10*3/uL (ref 0.7–4.0)
MCH: 31.4 pg (ref 26.0–34.0)
MCHC: 33.5 g/dL (ref 30.0–36.0)
MCV: 93.6 fL (ref 80.0–100.0)
Monocytes Absolute: 0.6 10*3/uL (ref 0.1–1.0)
Monocytes Relative: 6 %
Neutro Abs: 8.2 10*3/uL — ABNORMAL HIGH (ref 1.7–7.7)
Neutrophils Relative %: 81 %
Platelet Count: 258 10*3/uL (ref 150–400)
RBC: 4.37 MIL/uL (ref 4.22–5.81)
RDW: 12.4 % (ref 11.5–15.5)
WBC Count: 10.2 10*3/uL (ref 4.0–10.5)
nRBC: 0 % (ref 0.0–0.2)

## 2020-05-11 IMAGING — DX DG PELVIS 1-2V
2 series · 2 of 2 positions shown · non-contrast
Comparison: CT [DATE]

CLINICAL DATA: Left hip pain after fall

EXAM:
PELVIS - 1-2 VIEW

[pelvis ap (1 of 2)]
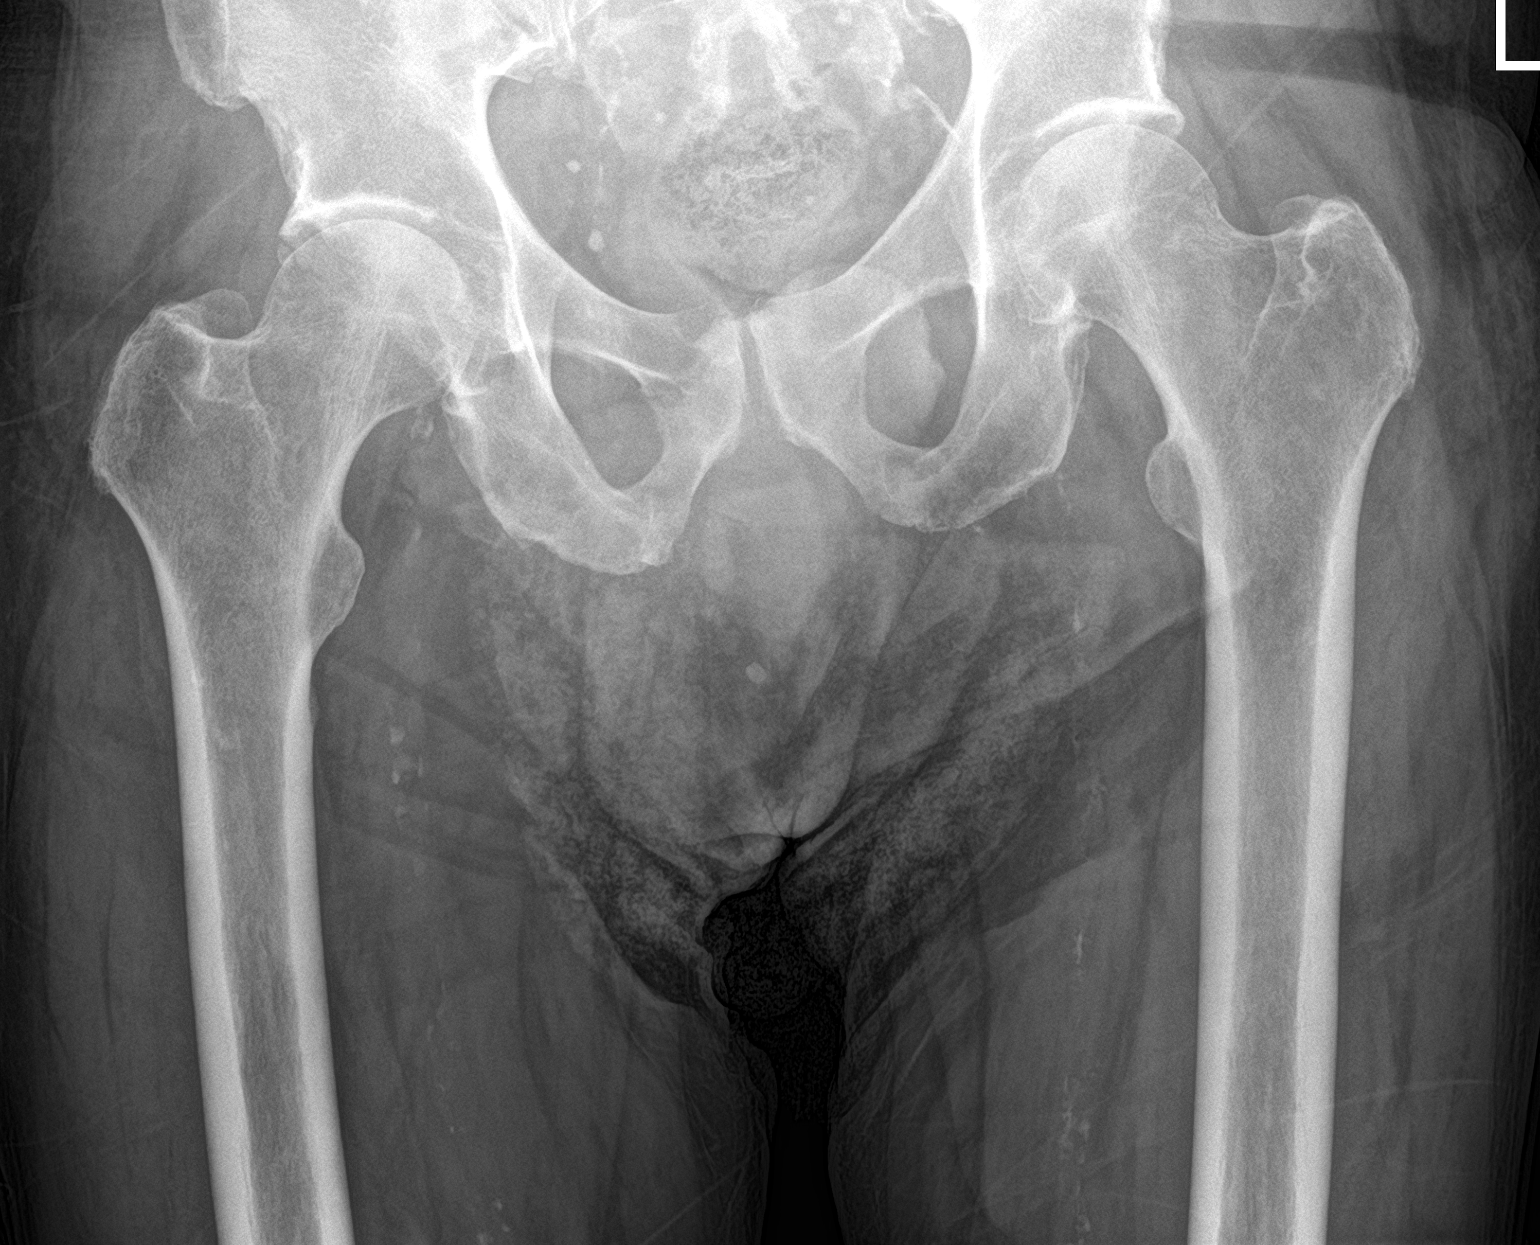

[pelvis ap (2 of 2)]
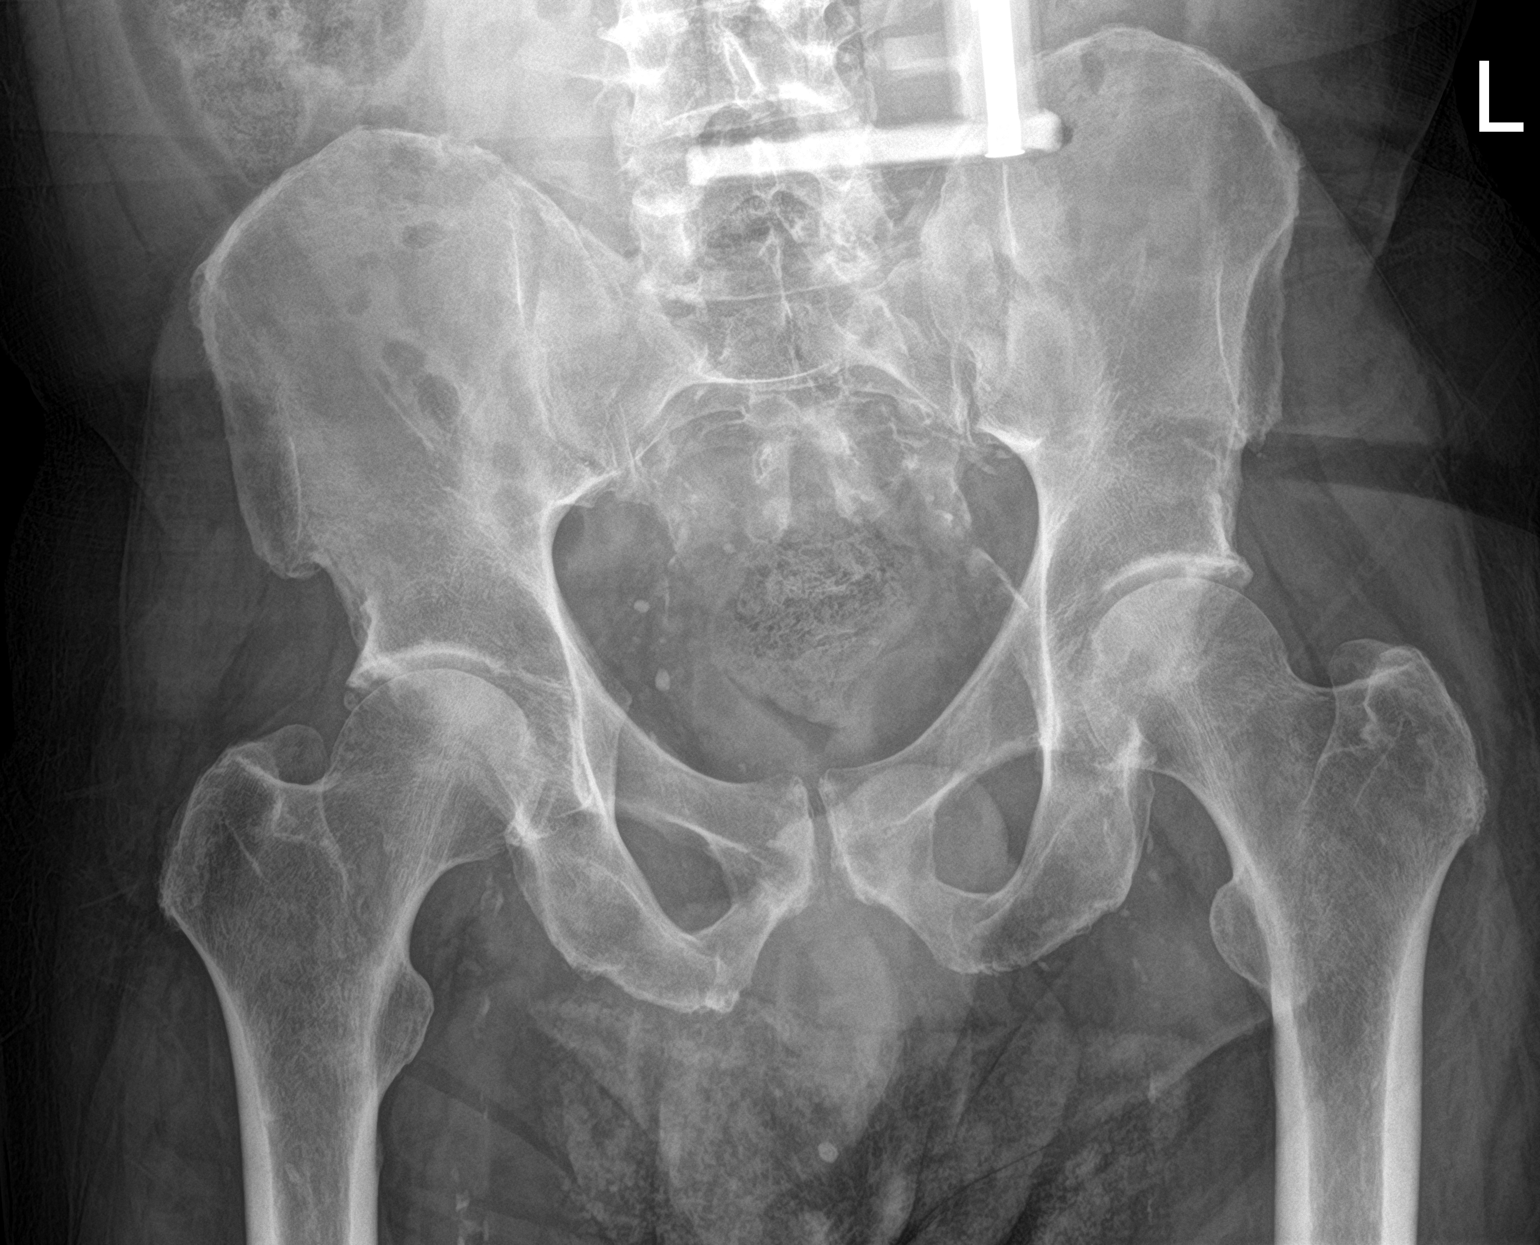

[2 of 2 positions shown; findings below may reference images not displayed]

FINDINGS: Pubic symphysis and rami are intact. No fracture or malalignment.
The SI joints are non widened. Incompletely visualized partially
metallic presumed artifact over the lower lumbar spine and left
iliac crest. Vascular calcifications.
IMPRESSION: No acute osseous abnormality

## 2020-05-11 NOTE — Progress Notes (Signed)
[facility] [place], [place] [address] [phone]   Interval Evaluation  Date of Service: 05/11/20 Patient Name: Corey Rasmussen Patient MRN: [id number] Patient DOB: 11-Nov-1960 Provider: Ventura Sellers, MD  Identifying Statement:  Corey Rasmussen is a 59 y.o. male with right frontal glioblastoma   Oncologic History: Oncology History  Glioblastoma, IDH-wildtype ([place])  03/18/2020 Surgery   Craniotomy, resection by Dr. Brett Rasmussen at [facility].  Path demonstrates glioblastoma   04/15/2020 -  Chemotherapy   The patient had temozolomide (TEMODAR) 140 MG capsule, 140 mg (100 % of original dose 140 mg), Oral, Daily, 1 of 1 cycle, Start date: 04/15/2020, End date: -- Dose modification: 140 mg (original dose 140 mg, Cycle 1)  for chemotherapy treatment.      Biomarkers:  MGMT Unknown.  IDH 1/2 Wild type.  EGFR Unknown  TERT "Mutated   Interval History:  Corey Rasmussen presents today for follow up, now having completed 2 weeks of radiation and concurrent Temozolomide.  Today he complains of significant pain in his left hip, following a fall at home several days ago.  He feels pain when sitting upright, less discomfort when sitting forward.  Tylenol has not helped.  Otherwise no issues with radiation or temodar.  Continues to use a walker or wheelchair for ambulation, no further seizures.  Decadron at 64m daily.  H+P (04/15/20) Patient presents today to for new evaluation for glioblastoma.  He presented to medical attention in May 2021 after several episodes of "blacking out, loss of consciousness" while at home.  CT head was obtained which demonstrated large enhancing mass.  [facility] resection by Dr. FBrett Albinoon 03/18/20.  Following surgery he developed left sided weakness, for which he remained in rehab facility for ~10 days prior to discharge to home.  Currently he is at home with family members for help.   He is able to ambulate with the help of a four pointed walker due to left leg weakness, although at times he requires a wheelchair.  His left arm is stronger now and able to perform basic tasks.  No recurrence of seizures, and no headaches, remains on Keppra 10057mBID.  Currently dosing decadron at 31m79mID, he has been on decadron since undergoing surgery 1 month ago.     Medications: Current Outpatient Medications on File Prior to Visit  Medication Sig Dispense Refill  . acetaminophen (TYLENOL) 325 MG tablet Take by mouth.    . aMarland Kitchenpirin 81 MG EC tablet Take by mouth.    . aMarland Kitchenorvastatin (LIPITOR) 10 MG tablet Take by mouth.    . Cysteamine Bitartrate (PROCYSBI) 300 MG PACK Use 1 each 4 (four) times daily Use as instructed.    . dMarland Kitchenxamethasone (DECADRON) 2 MG tablet Take by mouth.    . fludrocortisone (FLORINEF) 0.1 MG tablet Take by mouth.    . gMarland Kitchenucose blood (PRECISION QID TEST) test strip 1 each (1 strip total) by XX route 4 (four) times daily Use as instructed.    . insulin regular (NOVOLIN R) 100 units/mL injection Inject into the skin.    . lMarland KitchenvETIRAcetam (KEPPRA) 1000 MG tablet Take by mouth.    . melatonin 3 MG TABS tablet 1 tablet (3 mg total) by Via Tube route nightly    . ondansetron (ZOFRAN) 8 MG tablet Take 1 tablet (8 mg total) by mouth 2 (two) times daily as needed (nausea and vomiting). May  take 30-60 minutes prior to Temodar administration if nausea/vomiting occurs. 30 tablet 1  . pantoprazole (PROTONIX) 40 MG tablet Take by mouth.    . potassium chloride SA (KLOR-CON) 20 MEQ tablet Take by mouth.    . senna-docusate (SENOKOT-S) 8.6-50 MG tablet Take by mouth.    . temozolomide (TEMODAR) 140 MG capsule Take 1 capsule (140 mg total) by mouth daily. May take on an empty stomach to decrease nausea & vomiting. 42 capsule 0   No current facility-administered medications on file prior to visit.    Allergies: No Known Allergies Past Medical History:  Past Medical History:  Diagnosis  Date  . Diabetes mellitus without complication ([place])   . Hypercholesteremia   . Hypertension    Past Surgical History:  Past Surgical History:  Procedure Laterality Date  . EYE SURGERY    . FOOT SURGERY    . HAND SURGERY Right   . KNEE SURGERY     Social History:  Social History   Socioeconomic History  . Marital status: Single    Spouse name: Not on file  . Number of children: Not on file  . Years of education: Not on file  . Highest education level: Not on file  Occupational History  . Not on file  Tobacco Use  . Smoking status: Current Every Day Smoker    Packs/day: 0.50    Types: Cigarettes  . Smokeless tobacco: Never Used  Vaping Use  . Vaping Use: Never used  Substance and Sexual Activity  . Alcohol use: Never  . Drug use: No  . Sexual activity: Yes    Birth control/protection: None  Other Topics Concern  . Not on file  Social History Narrative  . Not on file   Social Determinants of Health   Financial Resource Strain:   . Difficulty of Paying Living Expenses:   Food Insecurity:   . Worried About Charity fundraiser in the Last Year:   . Arboriculturist in the Last Year:   Transportation Needs:   . Film/video editor (Medical):   Marland Kitchen Lack of Transportation (Non-Medical):   Physical Activity:   . Days of Exercise per Week:   . Minutes of Exercise per Session:   Stress:   . Feeling of Stress :   Social Connections:   . Frequency of Communication with Friends and Family:   . Frequency of Social Gatherings with Friends and Family:   . Attends Religious Services:   . Active Member of Clubs or Organizations:   . Attends Archivist Meetings:   Marland Kitchen Marital Status:   Intimate Partner Violence:   . Fear of Current or Ex-Partner:   . Emotionally Abused:   Marland Kitchen Physically Abused:   . Sexually Abused:    Family History: No family history on file.  Review of Systems: Constitutional: Doesn't report fevers, chills or abnormal weight loss Eyes:  Doesn't report blurriness of vision Ears, nose, mouth, throat, and face: Doesn't report sore throat Respiratory: Doesn't report cough, dyspnea or wheezes Cardiovascular: Doesn't report palpitation, chest discomfort  Gastrointestinal:  Doesn't report nausea, constipation, diarrhea GU: Doesn't report incontinence Skin: Doesn't report skin rashes Neurological: Per HPI Musculoskeletal: +++left hip pain Behavioral/Psych: ++anxiety  Physical Exam: Vitals:   05/11/20 1421  BP: (!) 166/106  Pulse: 72  Resp: 18  Temp: 98.2 F (36.8 C)  SpO2: 99%   KPS: 60. General: Alert, cooperative, pleasant, in no acute distress Head: Normal EENT: No conjunctival injection or  scleral icterus.  Lungs: Resp effort normal Cardiac: Regular rate Abdomen: Non-distended abdomen Skin: No rashes cyanosis or petechiae. Extremities: point tenderness left hip  Neurologic Exam: Mental Status: Awake, alert, attentive to examiner. Oriented to self and environment. Language is fluent with intact comprehension. Emotional lability noted. Cranial Nerves: Visual acuity is grossly normal. Visual fields are full. Extra-ocular movements intact. No ptosis. Face is symmetric Motor: Tone and bulk are normal. Power is 4+/5 in left arm with impaired fine motor function.  Left leg is 3/5. Reflexes are symmetric, no pathologic reflexes present.  Sensory: Intact to light touch Gait: Non-ambulatory   Labs: I have reviewed the data as listed    Component Value Date/Time   NA 137 03/13/2020 0217   K 3.9 03/13/2020 0217   CL 102 03/13/2020 0217   CO2 27 03/13/2020 0217   GLUCOSE 216 (H) 03/13/2020 0217   BUN 16 03/13/2020 0217   CREATININE 1.18 03/13/2020 0217   CALCIUM 9.7 03/13/2020 0217   PROT 7.8 03/13/2020 0217   ALBUMIN 4.0 03/13/2020 0217   AST 14 (L) 03/13/2020 0217   ALT 9 03/13/2020 0217   ALKPHOS 56 03/13/2020 0217   BILITOT 0.6 03/13/2020 0217   GFRNONAA >60 03/13/2020 0217   GFRAA >60 03/13/2020 0217    Lab Results  Component Value Date   WBC 10.2 05/11/2020   NEUTROABS 8.2 (H) 05/11/2020   HGB 13.7 05/11/2020   HCT 40.9 05/11/2020   MCV 93.6 05/11/2020   PLT 258 05/11/2020     Assessment/Plan Glioblastoma, IDH-wildtype ([place]) [C71.9]  Corey Rasmussen is neurologically stable today, now having completed 2 weeks of IMRT and concurrent Temodar.  His focal pain complaint is concerning for frank orthopedic injury, unclear etiology at this time.    Left sided weakness is stable, labs are WNL.  We recommended continuing with course of intensity modulated radiation therapy and concurrent daily Temozolomide.  Radiation will be administered Mon-Fri over 6 weeks, Temodar will be dosed at 66m/m2 to be given daily over 42 days.   Chemotherapy should be held for the following:  ANC less than 1,000  Platelets less than 100,000  LFT or creatinine greater than 2x ULN  If clinical concerns/contraindications develop  For now will con't 218mdaily dexamethasone and Keppra 100052mID.  We will ask for STAT pelvis/hip x-ray films to be performed.  They have asked for urgent visit with PCP as well to review orthopedic issue.  This is not related to the brain tumor.  Otherwise will follow up again in 2 weeks with labs for review.  We appreciate the opportunity to participate in the care of FelGustavusAll questions were answered. The patient knows to call the clinic with any problems, questions or concerns. No barriers to learning were detected.  I have spent a total of 30 minutes of face-to-face and non-face-to-face time, excluding clinical staff time, preparing to see patient, ordering tests and/or medications, counseling the patient, and care coordination    ZacVentura SellersD Medical Director of Neuro-Oncology [facility]/20/21 2:21 PM

## 2020-05-12 ENCOUNTER — Ambulatory Visit
Admission: RE | Admit: 2020-05-12 | Discharge: 2020-05-12 | Disposition: A | Discharge status: Home / Self Care | Payer: Self-pay | Source: Ambulatory Visit | Attending: Radiation Oncology | Admitting: Radiation Oncology

## 2020-05-12 ENCOUNTER — Other Ambulatory Visit: Payer: Self-pay

## 2020-05-13 ENCOUNTER — Ambulatory Visit
Admission: RE | Admit: 2020-05-13 | Discharge: 2020-05-13 | Disposition: A | Discharge status: Home / Self Care | Payer: Self-pay | Source: Ambulatory Visit | Attending: Radiation Oncology | Admitting: Radiation Oncology

## 2020-05-14 ENCOUNTER — Telehealth: Payer: Self-pay | Admitting: Internal Medicine

## 2020-05-14 ENCOUNTER — Ambulatory Visit
Admission: RE | Admit: 2020-05-14 | Discharge: 2020-05-14 | Disposition: A | Discharge status: Home / Self Care | Payer: Self-pay | Source: Ambulatory Visit | Attending: Radiation Oncology | Admitting: Radiation Oncology

## 2020-05-14 ENCOUNTER — Other Ambulatory Visit: Payer: Self-pay

## 2020-05-14 NOTE — Telephone Encounter (Signed)
No 7/20 los

## 2020-05-17 ENCOUNTER — Other Ambulatory Visit: Payer: Self-pay

## 2020-05-17 ENCOUNTER — Ambulatory Visit
Admission: RE | Admit: 2020-05-17 | Discharge: 2020-05-17 | Disposition: A | Discharge status: Home / Self Care | Payer: Self-pay | Source: Ambulatory Visit | Attending: Radiation Oncology | Admitting: Radiation Oncology

## 2020-05-18 ENCOUNTER — Ambulatory Visit
Admission: RE | Admit: 2020-05-18 | Discharge: 2020-05-18 | Disposition: A | Discharge status: Home / Self Care | Payer: Self-pay | Source: Ambulatory Visit | Attending: Radiation Oncology | Admitting: Radiation Oncology

## 2020-05-19 ENCOUNTER — Ambulatory Visit: Payer: Self-pay

## 2020-05-19 ENCOUNTER — Telehealth: Payer: Self-pay | Admitting: *Deleted

## 2020-05-19 NOTE — Telephone Encounter (Signed)
RETURNED PATIENT'S SISTER'S PHONE CALL, LVM FOR A RETURN CALL

## 2020-05-20 ENCOUNTER — Telehealth: Payer: Self-pay | Admitting: Internal Medicine

## 2020-05-20 ENCOUNTER — Telehealth: Payer: Self-pay | Admitting: *Deleted

## 2020-05-20 ENCOUNTER — Ambulatory Visit: Payer: Self-pay

## 2020-05-20 NOTE — Telephone Encounter (Signed)
Received call from [facility] [phone].  They received a call from [facility] about this patient who arrived in ED today with UTI.  The family requested hospice services and they reached out to [place] to get an order.  Advised that patient is currently under treatment and Dr. Mickeal Skinner needs to talk to family to explain how this will impact patient and his care.  Called Clarencesina and set up phone visit.  She states she is optimistic about his future but since they do not have insurance yet they are without resources and they were told that Hospice could provide resources.  He is pending full medicaid and is also waiting on a special D14 form to get him established with the [place] since he is a veteran but that turn around time is unknown.  They state he has fallen a lot out of the bed and they need a hospital bed but because of lack of insurance resources are limited.    Advised phone visit with Dr will give them an opportunity to discuss this further.

## 2020-05-21 ENCOUNTER — Ambulatory Visit
Admission: RE | Admit: 2020-05-21 | Discharge: 2020-05-21 | Disposition: A | Discharge status: Home / Self Care | Payer: Self-pay | Source: Ambulatory Visit | Attending: Radiation Oncology | Admitting: Radiation Oncology

## 2020-05-21 ENCOUNTER — Inpatient Hospital Stay (HOSPITAL_BASED_OUTPATIENT_CLINIC_OR_DEPARTMENT_OTHER): Payer: Self-pay | Admitting: Internal Medicine

## 2020-05-21 ENCOUNTER — Ambulatory Visit: Payer: Self-pay

## 2020-05-21 DIAGNOSIS — C719 Malignant neoplasm of brain, unspecified: Secondary | ICD-10-CM

## 2020-05-21 NOTE — Progress Notes (Signed)
I connected with Corey Rasmussen on 05/21/20 at 10:00 AM EDT by telephone visit and verified that I am speaking with the correct person using two identifiers.  I discussed the limitations, risks, security and privacy concerns of performing an evaluation and management service by telemedicine and the availability of in-person appointments. I also discussed with the patient that there may be a patient responsible charge related to this service. The patient expressed understanding and agreed to proceed.  Other persons participating in the visit and their role in the encounter:  Sister  Patient's location:  Home  Provider's location:  Theatre stage manager Complaint:  Glioblastoma, IDH-wildtype ([place])  History of Present Ilness: Corey Rasmussen required transport the ED yesterday after being found down somewhat confused.  He was found to have foul smelling urine, diagnosed with UTI with antibiotic (zithromax) and discharged back to home.  He did miss his radiation yesterday.  Today he feels normal, at baseline.  Plans to have family drive him over for his treatment this afternoon despite recent difficulties.   Observations: Language and cognition normal Assessment and Plan: Glioblastoma, IDH-wildtype ([place]) Con't with radiation and daily temozolomide as scheduled/ordered. Follow Up Instructions: RTC as scheduled on 05/24/20 for labs and in-person eval.   I discussed the assessment and treatment plan with the patient.  The patient was provided an opportunity to ask questions and all were answered.  The patient agreed with the plan and demonstrated understanding of the instructions.    The patient was advised to call back or seek an in-person evaluation if the symptoms worsen or if the condition fails to improve as anticipated.  I provided 5-10 minutes of non-face-to-face time during this enocunter.  Ventura Sellers, MD   I provided 15 minutes of non face-to-face telephone visit time during this encounter, and >  50% was spent counseling as documented under my assessment & plan.

## 2020-05-24 ENCOUNTER — Other Ambulatory Visit: Payer: Self-pay

## 2020-05-24 ENCOUNTER — Ambulatory Visit
Admission: RE | Admit: 2020-05-24 | Discharge: 2020-05-24 | Disposition: A | Discharge status: Home / Self Care | Payer: Self-pay | Source: Ambulatory Visit | Attending: Radiation Oncology | Admitting: Radiation Oncology

## 2020-05-24 DIAGNOSIS — C719 Malignant neoplasm of brain, unspecified: Secondary | ICD-10-CM | POA: Insufficient documentation

## 2020-05-25 ENCOUNTER — Other Ambulatory Visit: Payer: Self-pay

## 2020-05-25 ENCOUNTER — Inpatient Hospital Stay: Payer: Self-pay

## 2020-05-25 ENCOUNTER — Inpatient Hospital Stay: Payer: Self-pay | Attending: Internal Medicine | Admitting: Internal Medicine

## 2020-05-25 ENCOUNTER — Ambulatory Visit
Admission: RE | Admit: 2020-05-25 | Discharge: 2020-05-25 | Disposition: A | Discharge status: Home / Self Care | Payer: Self-pay | Source: Ambulatory Visit | Attending: Radiation Oncology | Admitting: Radiation Oncology

## 2020-05-25 VITALS — BP 157/98 | HR 73 | Temp 97.8°F | Resp 18 | Ht 71.0 in | Wt 188.0 lb

## 2020-05-25 DIAGNOSIS — Z7984 Long term (current) use of oral hypoglycemic drugs: Secondary | ICD-10-CM | POA: Insufficient documentation

## 2020-05-25 DIAGNOSIS — C719 Malignant neoplasm of brain, unspecified: Secondary | ICD-10-CM

## 2020-05-25 DIAGNOSIS — Z7982 Long term (current) use of aspirin: Secondary | ICD-10-CM | POA: Insufficient documentation

## 2020-05-25 DIAGNOSIS — E7800 Pure hypercholesterolemia, unspecified: Secondary | ICD-10-CM | POA: Insufficient documentation

## 2020-05-25 DIAGNOSIS — F17210 Nicotine dependence, cigarettes, uncomplicated: Secondary | ICD-10-CM | POA: Insufficient documentation

## 2020-05-25 DIAGNOSIS — I10 Essential (primary) hypertension: Secondary | ICD-10-CM | POA: Insufficient documentation

## 2020-05-25 DIAGNOSIS — R569 Unspecified convulsions: Secondary | ICD-10-CM

## 2020-05-25 DIAGNOSIS — Z79899 Other long term (current) drug therapy: Secondary | ICD-10-CM | POA: Insufficient documentation

## 2020-05-25 DIAGNOSIS — C711 Malignant neoplasm of frontal lobe: Secondary | ICD-10-CM | POA: Insufficient documentation

## 2020-05-25 DIAGNOSIS — E119 Type 2 diabetes mellitus without complications: Secondary | ICD-10-CM | POA: Insufficient documentation

## 2020-05-25 LAB — CBC WITH DIFFERENTIAL (CANCER CENTER ONLY)
Abs Immature Granulocytes: 0.08 10*3/uL — ABNORMAL HIGH (ref 0.00–0.07)
Basophils Absolute: 0 10*3/uL (ref 0.0–0.1)
Basophils Relative: 1 %
Eosinophils Absolute: 0 10*3/uL (ref 0.0–0.5)
Eosinophils Relative: 0 %
HCT: 42.4 % (ref 39.0–52.0)
Hemoglobin: 14.2 g/dL (ref 13.0–17.0)
Immature Granulocytes: 1 %
Lymphocytes Relative: 13 %
Lymphs Abs: 1.1 10*3/uL (ref 0.7–4.0)
MCH: 30.6 pg (ref 26.0–34.0)
MCHC: 33.5 g/dL (ref 30.0–36.0)
MCV: 91.4 fL (ref 80.0–100.0)
Monocytes Absolute: 0.3 10*3/uL (ref 0.1–1.0)
Monocytes Relative: 4 %
Neutro Abs: 7.2 10*3/uL (ref 1.7–7.7)
Neutrophils Relative %: 81 %
Platelet Count: 301 10*3/uL (ref 150–400)
RBC: 4.64 MIL/uL (ref 4.22–5.81)
RDW: 12.4 % (ref 11.5–15.5)
WBC Count: 8.8 10*3/uL (ref 4.0–10.5)
nRBC: 0 % (ref 0.0–0.2)

## 2020-05-25 LAB — CMP (CANCER CENTER ONLY)
ALT: 9 U/L (ref 0–44)
AST: 9 U/L — ABNORMAL LOW (ref 15–41)
Albumin: 3.5 g/dL (ref 3.5–5.0)
Alkaline Phosphatase: 68 U/L (ref 38–126)
Anion gap: 10 (ref 5–15)
BUN: 15 mg/dL (ref 6–20)
CO2: 24 mmol/L (ref 22–32)
Calcium: 9.9 mg/dL (ref 8.9–10.3)
Chloride: 106 mmol/L (ref 98–111)
Creatinine: 1.05 mg/dL (ref 0.61–1.24)
GFR, Est AFR Am: >60 mL/min (ref >60)
GFR, Estimated: >60 mL/min (ref >60)
Glucose, Bld: 222 mg/dL — ABNORMAL HIGH (ref 70–99)
Potassium: 3.8 mmol/L (ref 3.5–5.1)
Sodium: 140 mmol/L (ref 135–145)
Total Bilirubin: 0.3 mg/dL (ref 0.3–1.2)
Total Protein: 6.9 g/dL (ref 6.5–8.1)

## 2020-05-25 NOTE — Progress Notes (Signed)
[facility] at [place], [place] [address] [phone]   Interval Evaluation  Date of Service: 05/25/20 Patient Name: Corey Rasmussen Patient MRN: [id number] Patient DOB: 1961-05-29 Provider: Ventura Sellers, MD  Identifying Statement:  Corey Rasmussen is a 59 y.o. male with right frontal glioblastoma   Oncologic History: Oncology History  Glioblastoma, IDH-wildtype ([place])  03/18/2020 Surgery   Craniotomy, resection by Dr. Brett Albino at [facility].  Path demonstrates glioblastoma   04/15/2020 -  Chemotherapy   The patient had temozolomide (TEMODAR) 140 MG capsule, 140 mg (100 % of original dose 140 mg), Oral, Daily, 1 of 1 cycle, Start date: 04/15/2020, End date: -- Dose modification: 140 mg (original dose 140 mg, Cycle 1)  for chemotherapy treatment.      Biomarkers:  MGMT Unknown.  IDH 1/2 Wild type.  EGFR Unknown  TERT "Mutated   Interval History:  [name] presents today for follow up, now having completed 4 weeks of radiation and concurrent Temozolomide.  He has had several recent falls because of left leg weakness and poor general balance.  Currently he has an aide living with him full time to help with ADLs.  Mostly ambulating with a walker currently.  No further seizures.    H+P (04/15/20) Patient presents today to for new evaluation for glioblastoma.  He presented to medical attention in May 2021 after several episodes of "blacking out, loss of consciousness" while at home.  CT head was obtained which demonstrated large enhancing mass.  He was referred to [facility] for surgery, and underwent resection by Dr. Brett Albino on 03/18/20.  Following surgery he developed left sided weakness, for which he remained in rehab facility for ~10 days prior to discharge to home.  Currently he is at home with family members for help.  He is able to ambulate with the help of a four pointed walker due to left leg weakness, although at times he requires a  wheelchair.  His left arm is stronger now and able to perform basic tasks.  No recurrence of seizures, and no headaches, remains on Keppra 1045m BID.  Currently dosing decadron at 217mBID, he has been on decadron since undergoing surgery 1 month ago.     Medications: Current Outpatient Medications on File Prior to Visit  Medication Sig Dispense Refill  . acetaminophen (TYLENOL) 325 MG tablet Take by mouth.    . Marland Kitchenspirin 81 MG EC tablet Take by mouth.    . Marland Kitchentorvastatin (LIPITOR) 10 MG tablet Take by mouth.    . Cysteamine Bitartrate (PROCYSBI) 300 MG PACK Use 1 each 4 (four) times daily Use as instructed.    . fludrocortisone (FLORINEF) 0.1 MG tablet Take by mouth.    . Marland Kitchenlucose blood (PRECISION QID TEST) test strip 1 each (1 strip total) by XX route 4 (four) times daily Use as instructed.    . levETIRAcetam (KEPPRA) 1000 MG tablet Take by mouth.    . Marland Kitchenisinopril (ZESTRIL) 10 MG tablet Take 1 tablet by mouth daily.    . melatonin 3 MG TABS tablet 1 tablet (3 mg total) by Via Tube route nightly    . metFORMIN (GLUCOPHAGE) 500 MG tablet Take 500 mg by mouth 2 (two) times daily with a meal.    . ondansetron (ZOFRAN) 8 MG tablet Take 1 tablet (8 mg total) by mouth 2 (two) times daily as needed (nausea and vomiting). May take 30-60 minutes prior to Temodar administration if nausea/vomiting  occurs. 30 tablet 1  . potassium chloride SA (KLOR-CON) 20 MEQ tablet Take by mouth.    . senna-docusate (SENOKOT-S) 8.6-50 MG tablet Take by mouth.    . temozolomide (TEMODAR) 140 MG capsule Take 1 capsule (140 mg total) by mouth daily. May take on an empty stomach to decrease nausea & vomiting. 42 capsule 0   No current facility-administered medications on file prior to visit.    Allergies: No Known Allergies Past Medical History:  Past Medical History:  Diagnosis Date  . Diabetes mellitus without complication ([place])   . Hypercholesteremia   . Hypertension    Past Surgical History:  Past Surgical History:    Procedure Laterality Date  . EYE SURGERY    . FOOT SURGERY    . HAND SURGERY Right   . KNEE SURGERY     Social History:  Social History   Socioeconomic History  . Marital status: Single    Spouse name: Not on file  . Number of children: Not on file  . Years of education: Not on file  . Highest education level: Not on file  Occupational History  . Not on file  Tobacco Use  . Smoking status: Current Every Day Smoker    Packs/day: 0.50    Types: Cigarettes  . Smokeless tobacco: Never Used  Vaping Use  . Vaping Use: Never used  Substance and Sexual Activity  . Alcohol use: Never  . Drug use: No  . Sexual activity: Yes    Birth control/protection: None  Other Topics Concern  . Not on file  Social History Narrative  . Not on file   Social Determinants of Health   Financial Resource Strain:   . Difficulty of Paying Living Expenses:   Food Insecurity:   . Worried About Charity fundraiser in the Last Year:   . Arboriculturist in the Last Year:   Transportation Needs:   . Film/video editor (Medical):   Marland Kitchen Lack of Transportation (Non-Medical):   Physical Activity:   . Days of Exercise per Week:   . Minutes of Exercise per Session:   Stress:   . Feeling of Stress :   Social Connections:   . Frequency of Communication with Friends and Family:   . Frequency of Social Gatherings with Friends and Family:   . Attends Religious Services:   . Active Member of Clubs or Organizations:   . Attends Archivist Meetings:   Marland Kitchen Marital Status:   Intimate Partner Violence:   . Fear of Current or Ex-Partner:   . Emotionally Abused:   Marland Kitchen Physically Abused:   . Sexually Abused:    Family History: No family history on file.  Review of Systems: Constitutional: Doesn't report fevers, chills or abnormal weight loss Eyes: Doesn't report blurriness of vision Ears, nose, mouth, throat, and face: Doesn't report sore throat Respiratory: Doesn't report cough, dyspnea or  wheezes Cardiovascular: Doesn't report palpitation, chest discomfort  Gastrointestinal:  Doesn't report nausea, constipation, diarrhea GU: Doesn't report incontinence Skin: Doesn't report skin rashes Neurological: Per HPI Musculoskeletal: +++left hip pain Behavioral/Psych: ++anxiety  Physical Exam: Vitals:   05/25/20 1450  BP: (!) 157/98  Pulse: 73  Resp: 18  Temp: 97.8 F (36.6 C)  SpO2: 100%   KPS: 60. General: Alert, cooperative, pleasant, in no acute distress Head: Normal EENT: No conjunctival injection or scleral icterus.  Lungs: Resp effort normal Cardiac: Regular rate Abdomen: Non-distended abdomen Skin: No rashes cyanosis or petechiae. Extremities:  point tenderness left hip  Neurologic Exam: Mental Status: Awake, alert, attentive to examiner. Oriented to self and environment. Language is fluent with intact comprehension. Emotional lability noted. Cranial Nerves: Visual acuity is grossly normal. Visual fields are full. Extra-ocular movements intact. No ptosis. Face is symmetric Motor: Tone and bulk are normal. Power is 4+/5 in left arm with impaired fine motor function.  Left leg is 3/5. Reflexes are symmetric, no pathologic reflexes present.  Sensory: Intact to light touch Gait: Non-ambulatory   Labs: I have reviewed the data as listed    Component Value Date/Time   NA 141 05/11/2020 1407   K 3.8 05/11/2020 1407   CL 105 05/11/2020 1407   CO2 24 05/11/2020 1407   GLUCOSE 172 (H) 05/11/2020 1407   BUN 16 05/11/2020 1407   CREATININE 1.29 (H) 05/11/2020 1407   CALCIUM 10.4 (H) 05/11/2020 1407   PROT 7.3 05/11/2020 1407   ALBUMIN 3.3 (L) 05/11/2020 1407   AST 12 (L) 05/11/2020 1407   ALT 12 05/11/2020 1407   ALKPHOS 75 05/11/2020 1407   BILITOT 0.4 05/11/2020 1407   GFRNONAA 60 (L) 05/11/2020 1407   GFRAA >60 05/11/2020 1407   Lab Results  Component Value Date   WBC 8.8 05/25/2020   NEUTROABS 7.2 05/25/2020   HGB 14.2 05/25/2020   HCT 42.4  05/25/2020   MCV 91.4 05/25/2020   PLT 301 05/25/2020     Assessment/Plan Glioblastoma, IDH-wildtype ([place]) [C71.9]  Delia C Kelsay is neurologically stable today, now having completed 4 weeks of IMRT and concurrent Temodar.  Falls risk, ambulatory dysfunction are being managed more effectively now that more help is in the home.  Left sided weakness is stable, labs are WNL.  We recommended continuing with course of intensity modulated radiation therapy and concurrent daily Temozolomide.  Radiation will be administered Mon-Fri over 6 weeks, Temodar will be dosed at 60m/m2 to be given daily over 42 days.   Chemotherapy should be held for the following:  ANC less than 1,000  Platelets less than 100,000  LFT or creatinine greater than 2x ULN  If clinical concerns/contraindications develop  Will con't Keppra 1001mBID.  He will return to clinic in 2 weeks during final week of RT with labs for review.  We appreciate the opportunity to participate in the care of [place] All questions were answered. The patient knows to call the clinic with any problems, questions or concerns. No barriers to learning were detected.  I have spent a total of 30 minutes of face-to-face and non-face-to-face time, excluding clinical staff time, preparing to see patient, ordering tests and/or medications, counseling the patient, and care coordination    ZaVentura SellersMD Medical Director of Neuro-Oncology [facility] [place]8/03/21 2:55 PM

## 2020-05-26 ENCOUNTER — Other Ambulatory Visit: Payer: Self-pay

## 2020-05-26 ENCOUNTER — Ambulatory Visit
Admission: RE | Admit: 2020-05-26 | Discharge: 2020-05-26 | Disposition: A | Discharge status: Home / Self Care | Payer: Self-pay | Source: Ambulatory Visit | Attending: Radiation Oncology | Admitting: Radiation Oncology

## 2020-05-27 ENCOUNTER — Telehealth: Payer: Self-pay | Admitting: Internal Medicine

## 2020-05-27 ENCOUNTER — Ambulatory Visit
Admission: RE | Admit: 2020-05-27 | Discharge: 2020-05-27 | Disposition: A | Discharge status: Home / Self Care | Payer: Self-pay | Source: Ambulatory Visit | Attending: Radiation Oncology | Admitting: Radiation Oncology

## 2020-05-27 ENCOUNTER — Other Ambulatory Visit: Payer: Self-pay

## 2020-05-27 NOTE — Telephone Encounter (Signed)
No 8/3 los °

## 2020-05-28 ENCOUNTER — Other Ambulatory Visit: Payer: Self-pay

## 2020-05-28 ENCOUNTER — Ambulatory Visit
Admission: RE | Admit: 2020-05-28 | Discharge: 2020-05-28 | Disposition: A | Discharge status: Home / Self Care | Payer: Self-pay | Source: Ambulatory Visit | Attending: Radiation Oncology | Admitting: Radiation Oncology

## 2020-05-28 ENCOUNTER — Ambulatory Visit: Payer: Self-pay

## 2020-05-31 ENCOUNTER — Ambulatory Visit
Admission: RE | Admit: 2020-05-31 | Discharge: 2020-05-31 | Disposition: A | Discharge status: Home / Self Care | Payer: Self-pay | Source: Ambulatory Visit | Attending: Radiation Oncology | Admitting: Radiation Oncology

## 2020-05-31 ENCOUNTER — Ambulatory Visit: Payer: Self-pay

## 2020-05-31 ENCOUNTER — Other Ambulatory Visit: Payer: Self-pay

## 2020-06-01 ENCOUNTER — Ambulatory Visit: Payer: Self-pay

## 2020-06-01 ENCOUNTER — Ambulatory Visit
Admission: RE | Admit: 2020-06-01 | Discharge: 2020-06-01 | Disposition: A | Discharge status: Home / Self Care | Payer: Self-pay | Source: Ambulatory Visit | Attending: Radiation Oncology | Admitting: Radiation Oncology

## 2020-06-02 ENCOUNTER — Ambulatory Visit
Admission: RE | Admit: 2020-06-02 | Discharge: 2020-06-02 | Disposition: A | Discharge status: Home / Self Care | Payer: Self-pay | Source: Ambulatory Visit | Attending: Radiation Oncology | Admitting: Radiation Oncology

## 2020-06-02 ENCOUNTER — Ambulatory Visit: Payer: Self-pay

## 2020-06-02 ENCOUNTER — Other Ambulatory Visit: Payer: Self-pay

## 2020-06-03 ENCOUNTER — Ambulatory Visit
Admission: RE | Admit: 2020-06-03 | Discharge: 2020-06-03 | Disposition: A | Discharge status: Home / Self Care | Payer: Self-pay | Source: Ambulatory Visit | Attending: Radiation Oncology | Admitting: Radiation Oncology

## 2020-06-03 ENCOUNTER — Other Ambulatory Visit: Payer: Self-pay | Admitting: Radiation Therapy

## 2020-06-03 ENCOUNTER — Other Ambulatory Visit: Payer: Self-pay

## 2020-06-03 ENCOUNTER — Other Ambulatory Visit: Payer: Self-pay | Admitting: Internal Medicine

## 2020-06-03 DIAGNOSIS — C719 Malignant neoplasm of brain, unspecified: Secondary | ICD-10-CM

## 2020-06-04 ENCOUNTER — Other Ambulatory Visit: Payer: Self-pay

## 2020-06-04 ENCOUNTER — Ambulatory Visit
Admission: RE | Admit: 2020-06-04 | Discharge: 2020-06-04 | Disposition: A | Discharge status: Home / Self Care | Payer: Self-pay | Source: Ambulatory Visit | Attending: Radiation Oncology | Admitting: Radiation Oncology

## 2020-06-07 ENCOUNTER — Inpatient Hospital Stay (HOSPITAL_BASED_OUTPATIENT_CLINIC_OR_DEPARTMENT_OTHER): Payer: Self-pay | Admitting: Internal Medicine

## 2020-06-07 ENCOUNTER — Inpatient Hospital Stay: Payer: Self-pay

## 2020-06-07 ENCOUNTER — Ambulatory Visit: Payer: Self-pay

## 2020-06-07 ENCOUNTER — Other Ambulatory Visit: Payer: Self-pay

## 2020-06-07 ENCOUNTER — Ambulatory Visit
Admission: RE | Admit: 2020-06-07 | Discharge: 2020-06-07 | Disposition: A | Discharge status: Home / Self Care | Payer: Self-pay | Source: Ambulatory Visit | Attending: Radiation Oncology | Admitting: Radiation Oncology

## 2020-06-07 VITALS — BP 158/97 | HR 72 | Temp 96.5°F | Resp 18 | Ht 71.0 in | Wt 186.5 lb

## 2020-06-07 DIAGNOSIS — C719 Malignant neoplasm of brain, unspecified: Secondary | ICD-10-CM

## 2020-06-07 DIAGNOSIS — R569 Unspecified convulsions: Secondary | ICD-10-CM

## 2020-06-07 LAB — CMP (CANCER CENTER ONLY)
ALT: <6 U/L (ref 0–44)
AST: 6 U/L — ABNORMAL LOW (ref 15–41)
Albumin: 3.6 g/dL (ref 3.5–5.0)
Alkaline Phosphatase: 85 U/L (ref 38–126)
Anion gap: 11 (ref 5–15)
BUN: 15 mg/dL (ref 6–20)
CO2: 25 mmol/L (ref 22–32)
Calcium: 10.2 mg/dL (ref 8.9–10.3)
Chloride: 103 mmol/L (ref 98–111)
Creatinine: 1.01 mg/dL (ref 0.61–1.24)
GFR, Est AFR Am: >60 mL/min (ref >60)
GFR, Estimated: >60 mL/min (ref >60)
Glucose, Bld: 272 mg/dL — ABNORMAL HIGH (ref 70–99)
Potassium: 3.7 mmol/L (ref 3.5–5.1)
Sodium: 139 mmol/L (ref 135–145)
Total Bilirubin: 0.4 mg/dL (ref 0.3–1.2)
Total Protein: 7.3 g/dL (ref 6.5–8.1)

## 2020-06-07 LAB — CBC WITH DIFFERENTIAL (CANCER CENTER ONLY)
Abs Immature Granulocytes: 0.12 10*3/uL — ABNORMAL HIGH (ref 0.00–0.07)
Basophils Absolute: 0.1 10*3/uL (ref 0.0–0.1)
Basophils Relative: 1 %
Eosinophils Absolute: 0.1 10*3/uL (ref 0.0–0.5)
Eosinophils Relative: 1 %
HCT: 45.5 % (ref 39.0–52.0)
Hemoglobin: 15.2 g/dL (ref 13.0–17.0)
Immature Granulocytes: 1 %
Lymphocytes Relative: 11 %
Lymphs Abs: 1.1 10*3/uL (ref 0.7–4.0)
MCH: 30.6 pg (ref 26.0–34.0)
MCHC: 33.4 g/dL (ref 30.0–36.0)
MCV: 91.5 fL (ref 80.0–100.0)
Monocytes Absolute: 0.5 10*3/uL (ref 0.1–1.0)
Monocytes Relative: 5 %
Neutro Abs: 8 10*3/uL — ABNORMAL HIGH (ref 1.7–7.7)
Neutrophils Relative %: 81 %
Platelet Count: 274 10*3/uL (ref 150–400)
RBC: 4.97 MIL/uL (ref 4.22–5.81)
RDW: 12.9 % (ref 11.5–15.5)
WBC Count: 9.8 10*3/uL (ref 4.0–10.5)
nRBC: 0 % (ref 0.0–0.2)

## 2020-06-07 NOTE — Progress Notes (Signed)
[facility] at [place], [place] [address] [phone]   Interval Evaluation  Date of Service: 06/07/20 Patient Name: OSEIAS HORSEY Patient MRN: [id number] Patient DOB: 1961/09/17 Provider: Ventura Sellers, MD  Identifying Statement:  CASTLE LAMONS is a 59 y.o. male with right frontal glioblastoma   Oncologic History: Oncology History  Glioblastoma, IDH-wildtype ([place])  03/18/2020 Surgery   Craniotomy, resection by Dr. Brett Albino at [facility].  Path demonstrates glioblastoma   04/15/2020 -  Chemotherapy   The patient had temozolomide (TEMODAR) 140 MG capsule, 140 mg (100 % of original dose 140 mg), Oral, Daily, 1 of 1 cycle, Start date: 04/15/2020, End date: -- Dose modification: 140 mg (original dose 140 mg, Cycle 1)  for chemotherapy treatment.      Biomarkers:  MGMT Unknown.  IDH 1/2 Wild type.  EGFR Unknown  TERT "Mutated   Interval History:  JERONE CUDMORE presents today for follow up, now in final week of radiation and concurrent Temozolomide.  Left sided weakness is stable, no additional falls.  Still has an aide living with him full time to help with ADLs.  Continues to ambulate with a walker around the home.  No further seizures.    H+P (04/15/20) Patient presents today to for new evaluation for glioblastoma.  He presented to medical attention in May 2021 after several episodes of "blacking out, loss of consciousness" while at home.  CT head was obtained which demonstrated large enhancing mass.  He was referred to [facility] for surgery, and underwent resection by Dr. Brett Albino on 03/18/20.  Following surgery he developed left sided weakness, for which he remained in rehab facility for ~10 days prior to discharge to home.  Currently he is at home with family members for help.  He is able to ambulate with the help of a four pointed walker due to left leg weakness, although at times he requires a wheelchair.  His left arm is stronger now and  able to perform basic tasks.  No recurrence of seizures, and no headaches, remains on Keppra 1049m BID.  Currently dosing decadron at 226mBID, he has been on decadron since undergoing surgery 1 month ago.     Medications: Current Outpatient Medications on File Prior to Visit  Medication Sig Dispense Refill  . acetaminophen (TYLENOL) 325 MG tablet Take by mouth.    . Marland Kitchenspirin 81 MG EC tablet Take by mouth.    . Marland Kitchentorvastatin (LIPITOR) 10 MG tablet Take by mouth.    . CRANBERRY PO Take by mouth.    . Cysteamine Bitartrate (PROCYSBI) 300 MG PACK Use 1 each 4 (four) times daily Use as instructed.    . fludrocortisone (FLORINEF) 0.1 MG tablet Take by mouth.    . Marland Kitchenlucose blood (PRECISION QID TEST) test strip 1 each (1 strip total) by XX route 4 (four) times daily Use as instructed.    . levETIRAcetam (KEPPRA) 1000 MG tablet Take by mouth.    . Marland Kitchenisinopril (ZESTRIL) 10 MG tablet Take 1 tablet by mouth daily.    . melatonin 3 MG TABS tablet 1 tablet (3 mg total) by Via Tube route nightly    . metFORMIN (GLUCOPHAGE) 500 MG tablet Take 500 mg by mouth 2 (two) times daily with a meal.    . nicotine (NICODERM CQ - DOSED IN MG/24 HOURS) 21 mg/24hr patch Place 21 mg onto the skin daily.    . ondansetron (ZOFRAN) 8 MG tablet Take 1  tablet (8 mg total) by mouth 2 (two) times daily as needed (nausea and vomiting). May take 30-60 minutes prior to Temodar administration if nausea/vomiting occurs. 30 tablet 1  . polyethylene glycol (MIRALAX / GLYCOLAX) 17 g packet Take 17 g by mouth daily as needed.    . potassium chloride SA (KLOR-CON) 20 MEQ tablet Take by mouth.    . senna-docusate (SENOKOT-S) 8.6-50 MG tablet Take by mouth.    . temozolomide (TEMODAR) 140 MG capsule TAKE 1 CAPSULE (140 MG TOTAL) BY MOUTH DAILY. MAY TAKE ON AN EMPTY STOMACH TO DECREASE NAUSEA & VOMITING. 42 capsule 0   No current facility-administered medications on file prior to visit.    Allergies: No Known Allergies Past Medical History:    Past Medical History:  Diagnosis Date  . Diabetes mellitus without complication ([place])   . Hypercholesteremia   . Hypertension    Past Surgical History:  Past Surgical History:  Procedure Laterality Date  . EYE SURGERY    . FOOT SURGERY    . HAND SURGERY Right   . KNEE SURGERY     Social History:  Social History   Socioeconomic History  . Marital status: Single    Spouse name: Not on file  . Number of children: Not on file  . Years of education: Not on file  . Highest education level: Not on file  Occupational History  . Not on file  Tobacco Use  . Smoking status: Current Every Day Smoker    Packs/day: 0.50    Types: Cigarettes  . Smokeless tobacco: Never Used  Vaping Use  . Vaping Use: Never used  Substance and Sexual Activity  . Alcohol use: Never  . Drug use: No  . Sexual activity: Yes    Birth control/protection: None  Other Topics Concern  . Not on file  Social History Narrative  . Not on file   Social Determinants of Health   Financial Resource Strain:   . Difficulty of Paying Living Expenses:   Food Insecurity:   . Worried About Charity fundraiser in the Last Year:   . Arboriculturist in the Last Year:   Transportation Needs:   . Film/video editor (Medical):   Marland Kitchen Lack of Transportation (Non-Medical):   Physical Activity:   . Days of Exercise per Week:   . Minutes of Exercise per Session:   Stress:   . Feeling of Stress :   Social Connections:   . Frequency of Communication with Friends and Family:   . Frequency of Social Gatherings with Friends and Family:   . Attends Religious Services:   . Active Member of Clubs or Organizations:   . Attends Archivist Meetings:   Marland Kitchen Marital Status:   Intimate Partner Violence:   . Fear of Current or Ex-Partner:   . Emotionally Abused:   Marland Kitchen Physically Abused:   . Sexually Abused:    Family History: No family history on file.  Review of Systems: Constitutional: Doesn't report fevers,  chills or abnormal weight loss Eyes: Doesn't report blurriness of vision Ears, nose, mouth, throat, and face: Doesn't report sore throat Respiratory: Doesn't report cough, dyspnea or wheezes Cardiovascular: Doesn't report palpitation, chest discomfort  Gastrointestinal:  Doesn't report nausea, constipation, diarrhea GU: Doesn't report incontinence Skin: Doesn't report skin rashes Neurological: Per HPI Musculoskeletal: no swelling Behavioral/Psych: +anxiety  Physical Exam: Vitals:   06/07/20 1538  BP: (!) 158/97  Pulse: 72  Resp: 18  Temp: (!) 96.5  F (35.8 C)  SpO2: 100%   KPS: 60. General: Alert, cooperative, pleasant, in no acute distress Head: Normal EENT: No conjunctival injection or scleral icterus.  Lungs: Resp effort normal Cardiac: Regular rate Abdomen: Non-distended abdomen Skin: No rashes cyanosis or petechiae. Extremities: point tenderness left hip  Neurologic Exam: Mental Status: Awake, alert, attentive to examiner. Oriented to self and environment. Language is fluent with intact comprehension. Emotional lability noted. Cranial Nerves: Visual acuity is grossly normal. Visual fields are full. Extra-ocular movements intact. No ptosis. Face is symmetric Motor: Tone and bulk are normal. Power is 4+/5 in left arm with impaired fine motor function.  Left leg is 3/5. Reflexes are symmetric, no pathologic reflexes present.  Sensory: Intact to light touch Gait: Non-ambulatory   Labs: I have reviewed the data as listed    Component Value Date/Time   NA 140 05/25/2020 1434   K 3.8 05/25/2020 1434   CL 106 05/25/2020 1434   CO2 24 05/25/2020 1434   GLUCOSE 222 (H) 05/25/2020 1434   BUN 15 05/25/2020 1434   CREATININE 1.05 05/25/2020 1434   CALCIUM 9.9 05/25/2020 1434   PROT 6.9 05/25/2020 1434   ALBUMIN 3.5 05/25/2020 1434   AST 9 (L) 05/25/2020 1434   ALT 9 05/25/2020 1434   ALKPHOS 68 05/25/2020 1434   BILITOT 0.3 05/25/2020 1434   GFRNONAA >60 05/25/2020  1434   GFRAA >60 05/25/2020 1434   Lab Results  Component Value Date   WBC 9.8 06/07/2020   NEUTROABS 8.0 (H) 06/07/2020   HGB 15.2 06/07/2020   HCT 45.5 06/07/2020   MCV 91.5 06/07/2020   PLT 274 06/07/2020     Assessment/Plan Glioblastoma, IDH-wildtype ([place]) [C71.9]  Zaydn C Vestal is clinically stable today, now in final week of IMRT and concurrent Temodar.  No new or progressive deficits today.  We recommended completing course of intensity modulated radiation therapy and concurrent daily Temozolomide.  Radiation will be administered Mon-Fri over 6 weeks, Temodar will be dosed at 82m/m2 to be given daily over 42 days.   Chemotherapy should be held for the following:  ANC less than 1,000  Platelets less than 100,000  LFT or creatinine greater than 2x ULN  If clinical concerns/contraindications develop  Will con't Keppra 10033mBID.  Will touch base with social work regarding meInsurance underwriter We ask that FeDECORIAN SCHUENEMANNeturn to clinic in 1 months following next brain MRI, or sooner as needed.  We appreciate the opportunity to participate in the care of [place] All questions were answered. The patient knows to call the clinic with any problems, questions or concerns. No barriers to learning were detected.  I have spent a total of 30 minutes of face-to-face and non-face-to-face time, excluding clinical staff time, preparing to see patient, ordering tests and/or medications, counseling the patient, and care coordination    ZaVentura SellersMD Medical Director of Neuro-Oncology [facility] We[place]8/16/21 3:23 PM

## 2020-06-08 ENCOUNTER — Ambulatory Visit
Admission: RE | Admit: 2020-06-08 | Discharge: 2020-06-08 | Disposition: A | Discharge status: Home / Self Care | Payer: Self-pay | Source: Ambulatory Visit | Attending: Radiation Oncology | Admitting: Radiation Oncology

## 2020-06-08 ENCOUNTER — Other Ambulatory Visit: Payer: Self-pay

## 2020-06-08 ENCOUNTER — Telehealth: Payer: Self-pay | Admitting: Internal Medicine

## 2020-06-08 ENCOUNTER — Telehealth: Payer: Self-pay | Admitting: *Deleted

## 2020-06-08 ENCOUNTER — Ambulatory Visit: Payer: Self-pay

## 2020-06-08 NOTE — Telephone Encounter (Signed)
Scheduled per 8/16 los. Spoke with pt's sister and is aware of appt time and date.

## 2020-06-08 NOTE — Telephone Encounter (Signed)
Patients sister called requesting letter with statement of condition,diagnosis, prognosis and treatment plan for social security.  Routed to MD.

## 2020-06-09 ENCOUNTER — Other Ambulatory Visit: Payer: Self-pay

## 2020-06-09 ENCOUNTER — Ambulatory Visit: Payer: Self-pay

## 2020-06-09 ENCOUNTER — Ambulatory Visit
Admission: RE | Admit: 2020-06-09 | Discharge: 2020-06-09 | Disposition: A | Discharge status: Home / Self Care | Payer: Self-pay | Source: Ambulatory Visit | Attending: Radiation Oncology | Admitting: Radiation Oncology

## 2020-06-10 ENCOUNTER — Ambulatory Visit: Payer: Self-pay

## 2020-06-15 ENCOUNTER — Inpatient Hospital Stay: Payer: Self-pay

## 2020-06-21 ENCOUNTER — Other Ambulatory Visit: Payer: Self-pay | Admitting: *Deleted

## 2020-06-21 MED ORDER — DEXAMETHASONE 2 MG PO TABS: 2.0000 mg | ORAL_TABLET | Freq: Every day | ORAL | 0 refills | Status: AC

## 2020-06-21 MED ORDER — LEVETIRACETAM 1000 MG PO TABS: 1000.0000 mg | ORAL_TABLET | Freq: Two times a day (BID) | ORAL | 2 refills | Status: AC

## 2020-06-21 NOTE — Progress Notes (Signed)
Request for updated medication list to be faxed to [phone] to Dr. Rise Paganini with [facility] so that they can refill routine medications outside of the medications we order.

## 2020-06-22 ENCOUNTER — Other Ambulatory Visit: Payer: Self-pay | Admitting: Radiation Therapy

## 2020-06-29 ENCOUNTER — Ambulatory Visit (INDEPENDENT_AMBULATORY_CARE_PROVIDER_SITE_OTHER): Payer: Medicaid Other

## 2020-06-29 ENCOUNTER — Other Ambulatory Visit: Payer: Self-pay

## 2020-06-29 DIAGNOSIS — C719 Malignant neoplasm of brain, unspecified: Secondary | ICD-10-CM

## 2020-06-29 IMAGING — MR MR HEAD WO/W CM
13 series · 48 of 48 positions shown · IV contrast (gadavist)
Comparison: Pretreatment MRI [DATE].

CLINICAL DATA: 58-year-old male with enhancing mass at the anterior
corpus callosum glioblastoma IDH-wildtype discovered [REDACTED]. Status
post craniotomy [REDACTED], chemo-radiation since [REDACTED]

EXAM:
MRI HEAD WITHOUT AND WITH CONTRAST
TECHNIQUE: Multiplanar, multiecho pulse sequences of the brain and surrounding
structures were obtained without and with intravenous contrast.
CONTRAST:  10mL GADAVIST GADOBUTROL 1 MMOL/ML IV SOLN

[Series 6: DWI · coronal · 3.0mm · 1.15mm/px · 5 of 99 slices shown (1 of 4)]
[im 1/99]
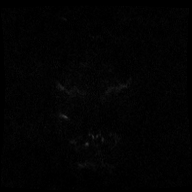
[im 25/99]
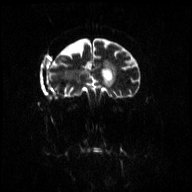
[im 50/99]
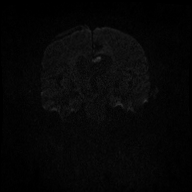
[im 74/99]
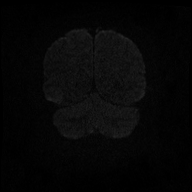
[im 99/99]
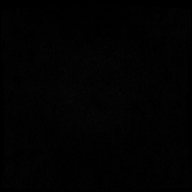

[Series 7: DWI · coronal · 3.0mm · 1.15mm/px · 3 of 49 slices shown (2 of 4)]
[im 1/49]
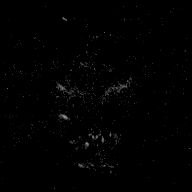
[im 25/49]
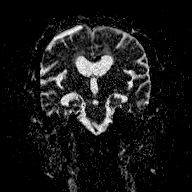
[im 49/49]
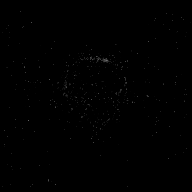

[Series 8: T1 · sagittal · 5.0mm · 0.47mm/px · 1 of 23 slices shown (1 of 2)]
[im 1/23]
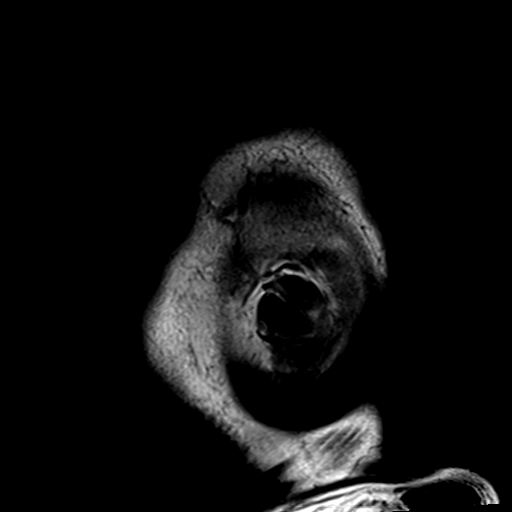

[Series 9: DWI · axial · 3.0mm · 1.20mm/px · z∈[-146,+15]mm · 6 of 108 slices shown (3 of 4)]
[im 1/108]
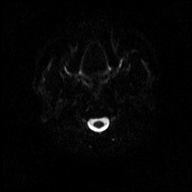
[im 22/108]
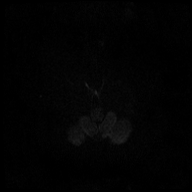
[im 43/108]
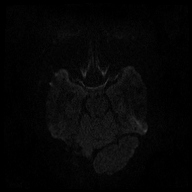
[im 65/108]
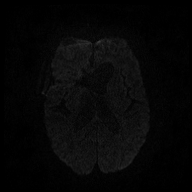
[im 86/108]
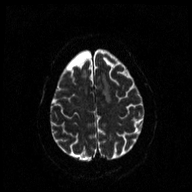
[im 108/108]
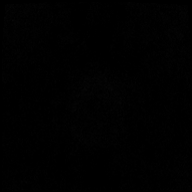

[Series 10: DWI · axial · 3.0mm · 1.20mm/px · z∈[-146,+15]mm · 3 of 55 slices shown (4 of 4)]
[im 1/55]
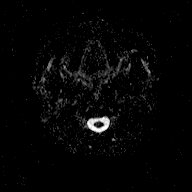
[im 28/55]
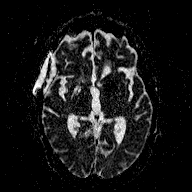
[im 55/55]
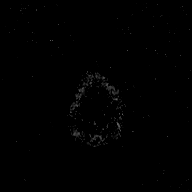

[Series 11: T2 · axial · 3.0mm · 0.72mm/px · z∈[-146,+15]mm · 3 of 55 slices shown (1 of 2)]
[im 1/55]
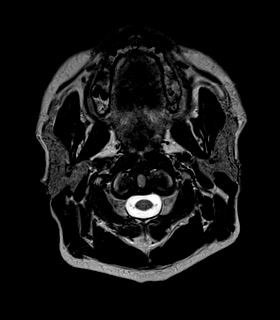
[im 28/55]
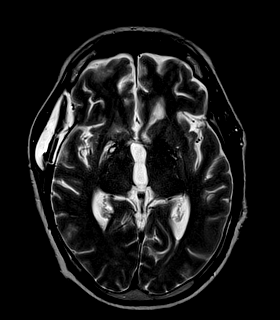
[im 55/55]
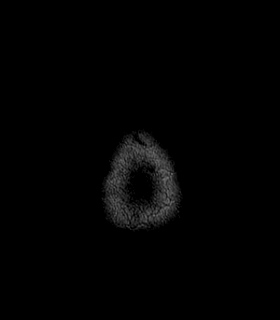

[Series 12: FLAIR · axial · 3.0mm · 0.45mm/px · z∈[-146,+15]mm · 3 of 55 slices shown]
[im 1/55]
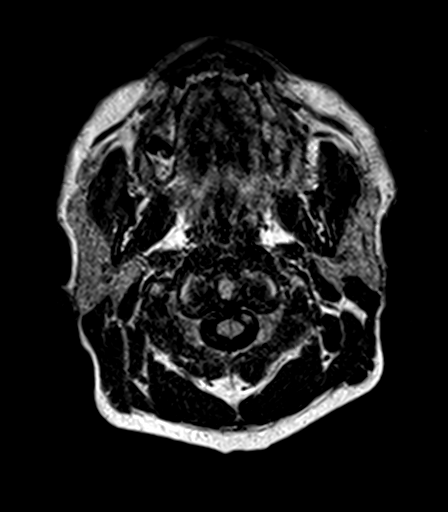
[im 28/55]
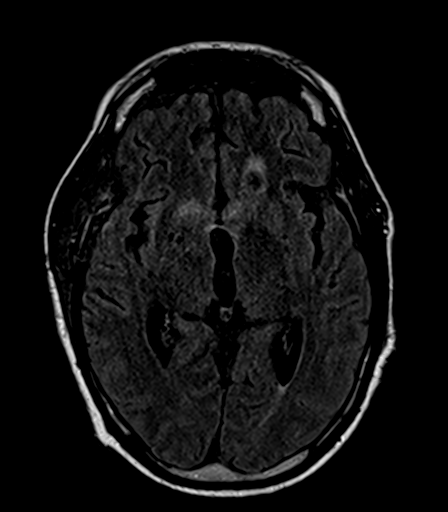
[im 55/55]
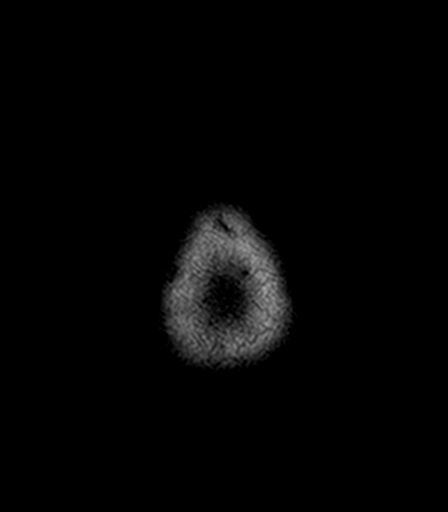

[Series 13: T2 · axial · 3.0mm · 0.72mm/px · z∈[-146,+15]mm · 3 of 55 slices shown (2 of 2)]
[im 1/55]
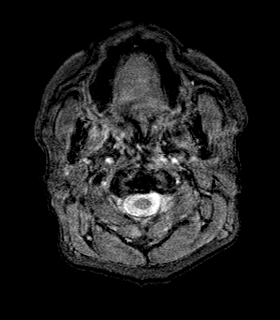
[im 28/55]
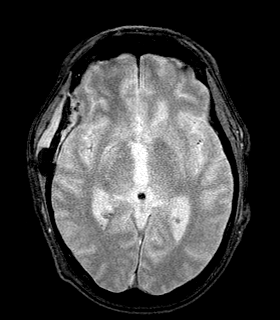
[im 55/55]
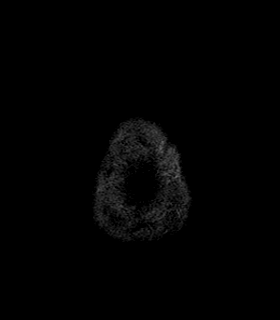

[Series 14: T1 · axial · 1.0mm · 1.00mm/px · z∈[-146,+12]mm · 8 of 160 slices shown (2 of 2)]
[im 1/160]
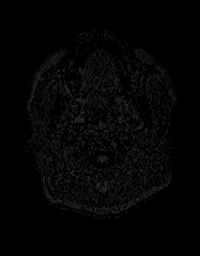
[im 23/160]
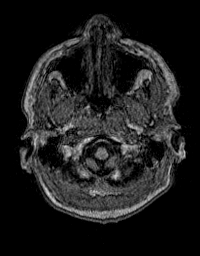
[im 46/160]
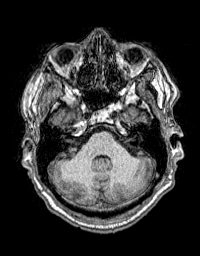
[im 69/160]
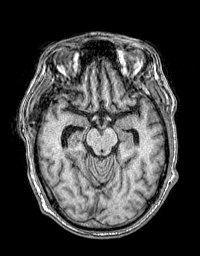
[im 91/160]
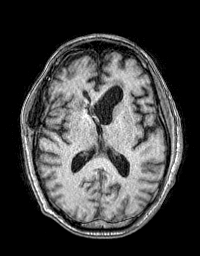
[im 114/160]
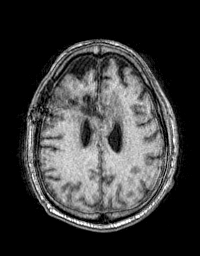
[im 137/160]
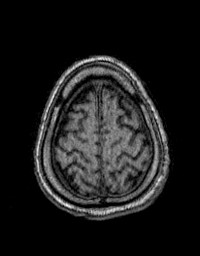
[im 160/160]
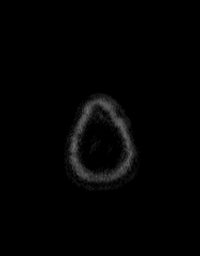

[Series 15: T2 post-contrast · coronal · 5.0mm · 0.43mm/px · 2 of 31 slices shown]
[im 1/31]
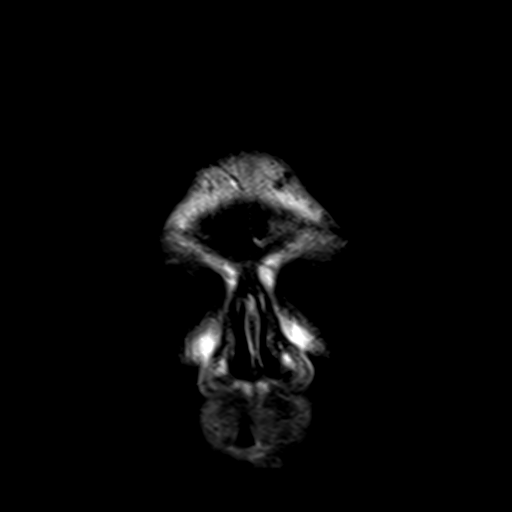
[im 31/31]
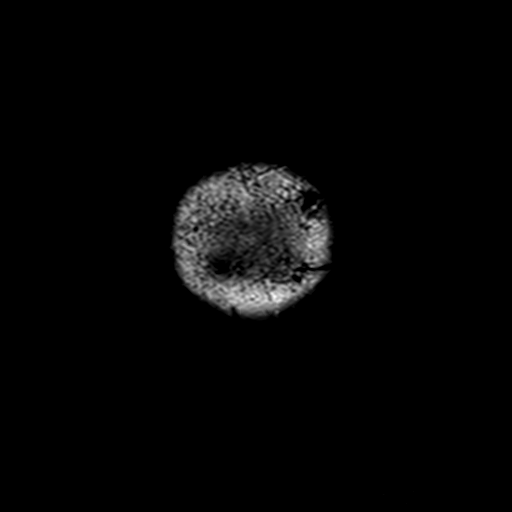

[Series 16: T1 post-contrast · axial · 1.0mm · 1.00mm/px · z∈[-146,+12]mm · 8 of 160 slices shown (1 of 3)]
[im 1/160]
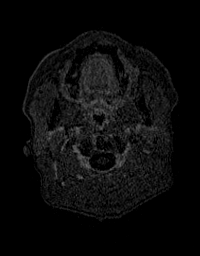
[im 23/160]
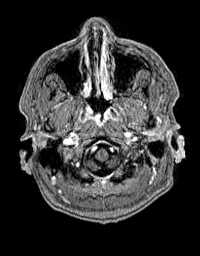
[im 46/160]
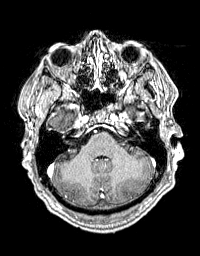
[im 69/160]
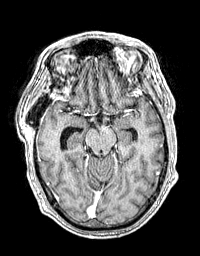
[im 91/160]
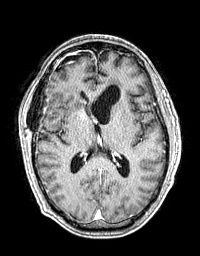
[im 114/160]
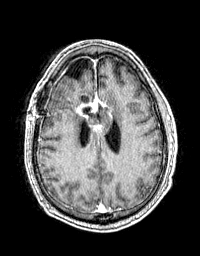
[im 137/160]
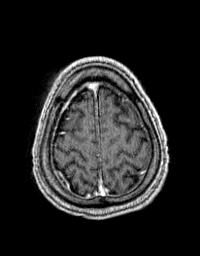
[im 160/160]
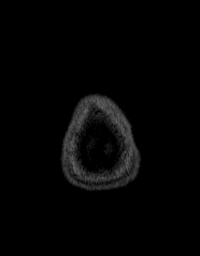

[Series 17: T1 post-contrast · coronal · 5.0mm · 0.43mm/px · 2 of 31 slices shown (2 of 3)]
[im 1/31]
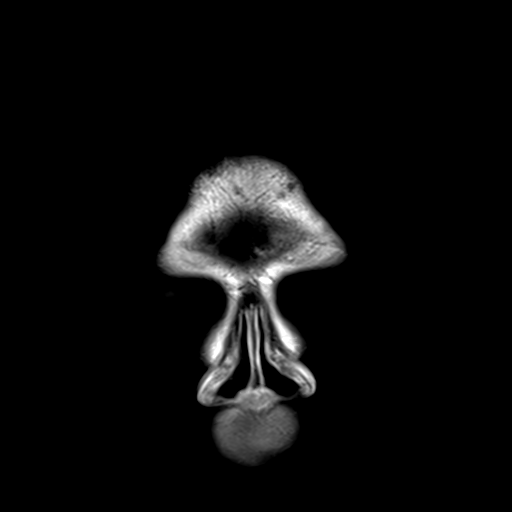
[im 31/31]
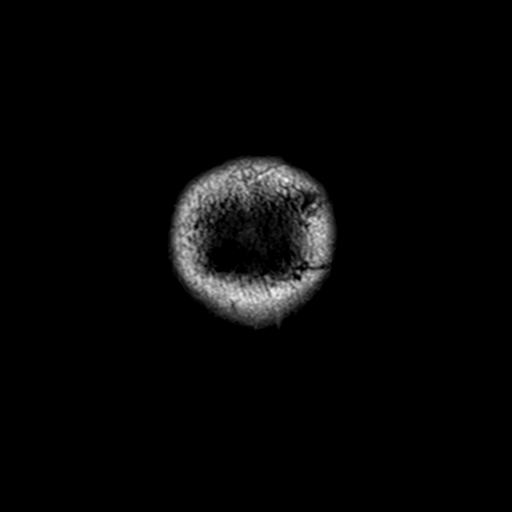

[Series 18: T1 post-contrast · sagittal · 5.0mm · 0.47mm/px · 1 of 23 slices shown (3 of 3)]
[im 1/23]
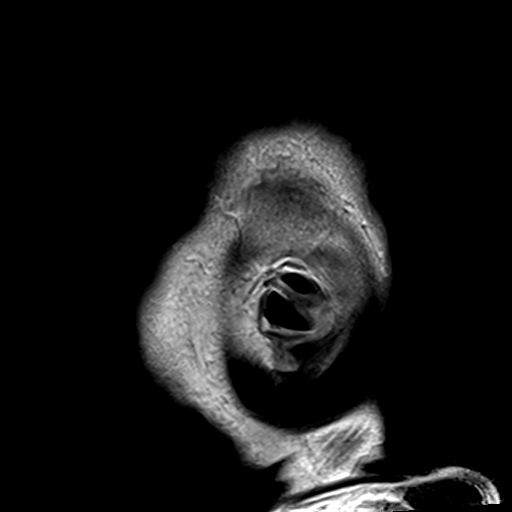

[48 of 48 positions shown; findings below may reference images not displayed]

FINDINGS: Brain: Right frontotemporal surgical changes with small thin
extra-axial fluid collection underlying the craniotomy and along the
right anterior convexity.

Surgical treatment of the large enhancing mass centered at the
anterior corpus callosum [REDACTED]. Resection cavity with T2
hyperintensity and mild hemosiderin. Regressed vasogenic edema
pattern T2 and FLAIR hyperintensity in the anterior right frontal
lobe. Residual masslike T2 and FLAIR hyperintensity along the
residual anterior corpus callosum as seen on series 12, image 38 and
series 15, image 18, with densely restricted diffusion. Additional
new lateral periventricular T2 and FLAIR hyperintensity also on
series 12, image 38. There is very little solid enhancement, but
rather stellate and nodular, irregular surrounding rim enhancement
(series 18 images 11 and 12) in an area of roughly 4.6 cm. And
similar rim enhancement tracks along the surgical approach to the
dura.

There is mild smooth postoperative dural thickening and enhancement
in the right hemisphere.

Frontal horn architectural distortion greater on the right. No
ventriculomegaly or ependymal enhancement.

No intracranial mass effect. Basilar cisterns remain normal. No
other restricted diffusion. No other extra-axial collection. Stable
gray and white matter signal elsewhere. Cervicomedullary junction
and pituitary are within normal limits.

Vascular: Major intracranial vascular flow voids are stable. The
major dural venous sinuses are enhancing and appear to be patent.

Skull and upper cervical spine: Partially visible cervical spine
degeneration. Right frontotemporal craniotomy. Elsewhere bone marrow
signal within normal limits.

Sinuses/Orbits: Negative.

Other: Mastoids remain clear. Visible internal auditory structures
appear normal. Postoperative changes to the right scalp with small
volume scalp seroma.
IMPRESSION: 1. This study will serve as a new post treatment baseline.
Interval tumor resection, with hypercellular versus ischemic soft
tissue at the residual body of the corpus callosum, and additional
nonspecific anterior periventricular T2/FLAIR hyperintensity.
Stellate and rim enhancement along the resection site.

2. No intracranial mass effect. Other postoperative changes
including small volume of extra-axial and scalp fluid.

## 2020-06-29 MED ORDER — GADOBUTROL 1 MMOL/ML IV SOLN
9.5000 mL | Freq: Once | INTRAVENOUS | Status: AC | PRN
Start: 2020-06-29 — End: 2020-06-29
  Administered 2020-06-29: 10 mL via INTRAVENOUS

## 2020-07-05 ENCOUNTER — Inpatient Hospital Stay: Payer: Medicaid Other | Attending: Internal Medicine | Admitting: Internal Medicine

## 2020-07-05 ENCOUNTER — Other Ambulatory Visit: Payer: Self-pay

## 2020-07-05 ENCOUNTER — Telehealth: Payer: Self-pay | Admitting: Radiation Oncology

## 2020-07-05 VITALS — BP 143/101 | HR 83 | Temp 96.3°F | Resp 18 | Ht 71.0 in | Wt 194.2 lb

## 2020-07-05 DIAGNOSIS — R569 Unspecified convulsions: Secondary | ICD-10-CM | POA: Diagnosis not present

## 2020-07-05 DIAGNOSIS — C719 Malignant neoplasm of brain, unspecified: Secondary | ICD-10-CM

## 2020-07-05 DIAGNOSIS — E119 Type 2 diabetes mellitus without complications: Secondary | ICD-10-CM | POA: Insufficient documentation

## 2020-07-05 DIAGNOSIS — Z7982 Long term (current) use of aspirin: Secondary | ICD-10-CM | POA: Insufficient documentation

## 2020-07-05 DIAGNOSIS — F17210 Nicotine dependence, cigarettes, uncomplicated: Secondary | ICD-10-CM | POA: Insufficient documentation

## 2020-07-05 DIAGNOSIS — E7800 Pure hypercholesterolemia, unspecified: Secondary | ICD-10-CM | POA: Insufficient documentation

## 2020-07-05 DIAGNOSIS — I10 Essential (primary) hypertension: Secondary | ICD-10-CM | POA: Insufficient documentation

## 2020-07-05 DIAGNOSIS — C711 Malignant neoplasm of frontal lobe: Secondary | ICD-10-CM | POA: Insufficient documentation

## 2020-07-05 DIAGNOSIS — Z923 Personal history of irradiation: Secondary | ICD-10-CM | POA: Insufficient documentation

## 2020-07-05 DIAGNOSIS — Z7189 Other specified counseling: Secondary | ICD-10-CM | POA: Diagnosis not present

## 2020-07-05 DIAGNOSIS — Z7952 Long term (current) use of systemic steroids: Secondary | ICD-10-CM | POA: Insufficient documentation

## 2020-07-05 DIAGNOSIS — Z7984 Long term (current) use of oral hypoglycemic drugs: Secondary | ICD-10-CM | POA: Insufficient documentation

## 2020-07-05 DIAGNOSIS — Z79899 Other long term (current) drug therapy: Secondary | ICD-10-CM | POA: Insufficient documentation

## 2020-07-05 NOTE — Telephone Encounter (Signed)
  Radiation Oncology         [phone] ________________________________  Name: Corey Rasmussen MRN: [id number]  Date of Service: 07/05/2020  DOB: 12-Jul-1961  Post Treatment Telephone Note  Diagnosis:   Glioblastoma involving the anterior falx at the level of the body and genu of the anterior corpus callosum.  Interval Since Last Radiation:  4 weeks   04/27/20-06/09/20: The primary brain target along the anterior falx was treated to 46 Gy in 23 fractions as well as a 14 Gy boost in 7 fractions.  Narrative:  The patient was contacted today for routine follow-up. During treatment he did very well with radiotherapy and did not have significant desquamation. He has been back to see Dr. Mickeal Skinner and is trying to decide whether he wants to pursue additional treatment.   Impression/Plan: 1. Glioblastoma involving the anterior falx at the level of the body and genu of the anterior corpus callosum. I was unable to reach the patient by the number in his chart but it appears that is actually his sister's number so I left a voicemail for her with our contact information should the patient have concerns regarding his radiotherapy treatment.    Carola Rhine, PAC

## 2020-07-05 NOTE — Progress Notes (Signed)
[facility] [place], [place] [address] [phone]   Interval Evaluation  Date of Service: 07/05/20 Patient Name: Corey Rasmussen Patient MRN: [id number] Patient DOB: [birth year] Provider: Ventura Sellers, MD  Identifying Statement:  Corey Rasmussen is a 59 y.o. male with right frontal glioblastoma   Oncologic History: Oncology History  Glioblastoma, IDH-wildtype ([place])  03/18/2020 Surgery   Craniotomy, resection by Dr. Brett Albino at [facility].  Path demonstrates glioblastoma   04/27/2020 - 06/09/2020 Radiation Therapy   IMRT and concurrent Temozolomide 63m/m2 daily     Biomarkers:  MGMT Unknown.  IDH 1/2 Wild type.  EGFR Unknown  TERT "Mutated   Interval History:  FMAVERIC DEBONOpresents today for follow up, now having completed radiation and concurrent Temozolomide.  He has been struggling with his poor quality of life, related to motor dysfunction, reliance on assistance for ADLs like toileting.  [facility].  Had an ED visit last week because of suicidal ideation, although today he is call and has no harmful intention.  Psychiatry recommended starting Mirtazapine which he has not yet picked up.  Left sided weakness is stable, he does have occassional falls.  Ambulating with walker or wheelchair at the facility.   H+P (04/15/20) Patient presents today to for new evaluation for glioblastoma.  He presented to medical attention in May 2021 after several episodes of "blacking out, loss of consciousness" while at home.  CT head was obtained which demonstrated large enhancing mass.  [facility] resection by Dr. FBrett Albinoon 03/18/20.  Following surgery he developed left sided weakness, for which he remained in rehab facility for ~10 days prior to discharge to home.  Currently he is at home with family members for help.  He is able  to ambulate with the help of a four pointed walker due to left leg weakness, although at times he requires a wheelchair.  His left arm is stronger now and able to perform basic tasks.  No recurrence of seizures, and no headaches, remains on Keppra 10033mBID.  Currently dosing decadron at 71m65mID, he has been on decadron since undergoing surgery 1 month ago.     Medications: Current Outpatient Medications on File Prior to Visit  Medication Sig Dispense Refill  . acetaminophen (TYLENOL) 325 MG tablet Take by mouth.    . aMarland Kitchenpirin 81 MG EC tablet Take by mouth.    . aMarland Kitchenorvastatin (LIPITOR) 10 MG tablet Take by mouth.    . CRANBERRY PO Take by mouth.    . Cysteamine Bitartrate (PROCYSBI) 300 MG PACK Use 1 each 4 (four) times daily Use as instructed.    . dMarland Kitchenxamethasone (DECADRON) 2 MG tablet Take 1 tablet (2 mg total) by mouth daily. 30 tablet 0  . fludrocortisone (FLORINEF) 0.1 MG tablet Take by mouth.    . gMarland Kitchenucose blood (PRECISION QID TEST) test strip 1 each (1 strip total) by XX route 4 (four) times daily Use as instructed.    . levETIRAcetam (KEPPRA) 1000 MG tablet Take 1 tablet (1,000 mg total) by mouth 2 (two) times daily. 60 tablet 2  . lisinopril (ZESTRIL) 10 MG tablet Take 1 tablet by mouth daily.    . melatonin 3 MG TABS tablet 1 tablet (3 mg total) by Via Tube route nightly    . metFORMIN (GLUCOPHAGE) 500 MG tablet Take 500 mg by  mouth 2 (two) times daily with a meal.    . nicotine (NICODERM CQ - DOSED IN MG/24 HOURS) 21 mg/24hr patch Place 21 mg onto the skin daily.    . ondansetron (ZOFRAN) 8 MG tablet Take 1 tablet (8 mg total) by mouth 2 (two) times daily as needed (nausea and vomiting). May take 30-60 minutes prior to Temodar administration if nausea/vomiting occurs. 30 tablet 1  . polyethylene glycol (MIRALAX / GLYCOLAX) 17 g packet Take 17 g by mouth daily as needed.    . potassium chloride SA (KLOR-CON) 20 MEQ tablet Take by mouth.    . senna-docusate (SENOKOT-S) 8.6-50 MG tablet  Take by mouth.    . temozolomide (TEMODAR) 140 MG capsule TAKE 1 CAPSULE (140 MG TOTAL) BY MOUTH DAILY. MAY TAKE ON AN EMPTY STOMACH TO DECREASE NAUSEA & VOMITING. 42 capsule 0   No current facility-administered medications on file prior to visit.    Allergies: No Known Allergies Past Medical History:  Past Medical History:  Diagnosis Date  . Diabetes mellitus without complication ([place])   . Hypercholesteremia   . Hypertension    Past Surgical History:  Past Surgical History:  Procedure Laterality Date  . EYE SURGERY    . FOOT SURGERY    . HAND SURGERY Right   . KNEE SURGERY     Social History:  Social History   Socioeconomic History  . Marital status: Single    Spouse name: Not on file  . Number of children: Not on file  . Years of education: Not on file  . Highest education level: Not on file  Occupational History  . Not on file  Tobacco Use  . Smoking status: Current Every Day Smoker    Packs/day: 0.50    Types: Cigarettes  . Smokeless tobacco: Never Used  Vaping Use  . Vaping Use: Never used  Substance and Sexual Activity  . Alcohol use: Never  . Drug use: No  . Sexual activity: Yes    Birth control/protection: None  Other Topics Concern  . Not on file  Social History Narrative  . Not on file   Social Determinants of Health   Financial Resource Strain:   . Difficulty of Paying Living Expenses: Not on file  Food Insecurity:   . Worried About Charity fundraiser in the Last Year: Not on file  . Ran Out of Food in the Last Year: Not on file  Transportation Needs:   . Lack of Transportation (Medical): Not on file  . Lack of Transportation (Non-Medical): Not on file  Physical Activity:   . Days of Exercise per Week: Not on file  . Minutes of Exercise per Session: Not on file  Stress:   . Feeling of Stress : Not on file  Social Connections:   . Frequency of Communication with Friends and Family: Not on file  . Frequency of Social Gatherings with Friends  and Family: Not on file  . Attends Religious Services: Not on file  . Active Member of Clubs or Organizations: Not on file  . Attends Archivist Meetings: Not on file  . Marital Status: Not on file  Intimate Partner Violence:   . Fear of Current or Ex-Partner: Not on file  . Emotionally Abused: Not on file  . Physically Abused: Not on file  . Sexually Abused: Not on file   Family History: No family history on file.  Review of Systems: Constitutional: Doesn't report fevers, chills or abnormal weight loss  Eyes: Doesn't report blurriness of vision Ears, nose, mouth, throat, and face: Doesn't report sore throat Respiratory: Doesn't report cough, dyspnea or wheezes Cardiovascular: Doesn't report palpitation, chest discomfort  Gastrointestinal:  Doesn't report nausea, constipation, diarrhea GU: Doesn't report incontinence Skin: Doesn't report skin rashes Neurological: Per HPI Musculoskeletal: no swelling Behavioral/Psych: +anxiety  Physical Exam: Vitals:   07/05/20 1248  BP: (!) 143/101  Pulse: 83  Resp: 18  Temp: (!) 96.3 F (35.7 C)  SpO2: 100%   KPS: 60. General: Alert, cooperative, pleasant, in no acute distress Head: Normal EENT: No conjunctival injection or scleral icterus.  Lungs: Resp effort normal Cardiac: Regular rate Abdomen: Non-distended abdomen Skin: No rashes cyanosis or petechiae. Extremities: point tenderness left hip  Neurologic Exam: Mental Status: Awake, alert, attentive to examiner. Oriented to self and environment. Language is fluent with intact comprehension. Emotional lability noted. Cranial Nerves: Visual acuity is grossly normal. Visual fields are full. Extra-ocular movements intact. No ptosis. Face is symmetric Motor: Tone and bulk are normal. Power is 4+/5 in left arm with impaired fine motor function.  Left leg is 3/5. Reflexes are symmetric, no pathologic reflexes present.  Sensory: Intact to light touch Gait:  Non-ambulatory   Labs: I have reviewed the data as listed    Component Value Date/Time   NA 139 06/07/2020 1459   K 3.7 06/07/2020 1459   CL 103 06/07/2020 1459   CO2 25 06/07/2020 1459   GLUCOSE 272 (H) 06/07/2020 1459   BUN 15 06/07/2020 1459   CREATININE 1.01 06/07/2020 1459   CALCIUM 10.2 06/07/2020 1459   PROT 7.3 06/07/2020 1459   ALBUMIN 3.6 06/07/2020 1459   AST 6 (L) 06/07/2020 1459   ALT <6 06/07/2020 1459   ALKPHOS 85 06/07/2020 1459   BILITOT 0.4 06/07/2020 1459   GFRNONAA >60 06/07/2020 1459   GFRAA >60 06/07/2020 1459   Lab Results  Component Value Date   WBC 9.8 06/07/2020   NEUTROABS 8.0 (H) 06/07/2020   HGB 15.2 06/07/2020   HCT 45.5 06/07/2020   MCV 91.5 06/07/2020   PLT 274 06/07/2020    Imaging:  [facility] Clinician Interpretation: I have personally reviewed the CNS images as listed.  My interpretation, in the context of the patient's clinical presentation, is treatment effect vs true progression  MR BRAIN W WO CONTRAST  Result Date: 06/29/2020 CLINICAL DATA:  59 year old male with enhancing mass at the anterior corpus callosum glioblastoma IDH-wildtype discovered in May. Status post craniotomy in May, chemo-radiation since June EXAM: MRI HEAD WITHOUT AND WITH CONTRAST TECHNIQUE: Multiplanar, multiecho pulse sequences of the brain and surrounding structures were obtained without and with intravenous contrast. CONTRAST:  78m GADAVIST GADOBUTROL 1 MMOL/ML IV SOLN COMPARISON:  Pretreatment MRI 03/13/2020. FINDINGS: Brain: Right frontotemporal surgical changes with small thin extra-axial fluid collection underlying the craniotomy and along the right anterior convexity. Surgical treatment of the large enhancing mass centered at the anterior corpus callosum since May. Resection cavity with T2 hyperintensity and mild hemosiderin. Regressed vasogenic edema pattern T2 and FLAIR hyperintensity in the anterior right frontal lobe. Residual masslike T2 and FLAIR  hyperintensity along the residual anterior corpus callosum as seen on series 12, image 38 and series 15, image 18, with densely restricted diffusion. Additional new lateral periventricular T2 and FLAIR hyperintensity also on series 12, image 38. There is very little solid enhancement, but rather stellate and nodular, irregular surrounding rim enhancement (series 18 images 11 and 12) in an area of roughly 4.6 cm. And similar rim  enhancement tracks along the surgical approach to the dura. There is mild smooth postoperative dural thickening and enhancement in the right hemisphere. Frontal horn architectural distortion greater on the right. No ventriculomegaly or ependymal enhancement. No intracranial mass effect. Basilar cisterns remain normal. No other restricted diffusion. No other extra-axial collection. Stable gray and white matter signal elsewhere. Cervicomedullary junction and pituitary are within normal limits. Vascular: Major intracranial vascular flow voids are stable. The major dural venous sinuses are enhancing and appear to be patent. Skull and upper cervical spine: Partially visible cervical spine degeneration. Right frontotemporal craniotomy. Elsewhere bone marrow signal within normal limits. Sinuses/Orbits: Negative. Other: Mastoids remain clear. Visible internal auditory structures appear normal. Postoperative changes to the right scalp with small volume scalp seroma. IMPRESSION: 1. This study will serve as a new post treatment baseline. Interval tumor resection, with hypercellular versus ischemic soft tissue at the residual body of the corpus callosum, and additional nonspecific anterior periventricular T2/FLAIR hyperintensity. Stellate and rim enhancement along the resection site. 2. No intracranial mass effect. Other postoperative changes including small volume of extra-axial and scalp fluid. Electronically Signed   By: Genevie Ann M.D.   On: 06/29/2020 13:20    Assessment/Plan Glioblastoma,  IDH-wildtype ([place]) [C71.9]  Evart C Murri is clinically stable today, now having completed IMRT and concurrent Temodar.  His deficits continue to limit his functional status and overall quality of life.    We had an extensive conversation today regarding goals of care.  He understands his condition is not considered curable even with maximal therapy.  Chemotherapy can potentially give him more time, but this is not certain. We also discussed palliative care pathways including hospice.  He is unsure at this time if he wants to proceed with further medical measure which would be life-prolonging, which at this time would consist of further oral chemotherapy and usual medical visits/labs/scans. He would like to take some time to discuss with family before making a formal decision.  In the meantime he will con't Keppra 1065m BID and decadron 267mdaily.  Strongly advised starting Remeron per psych recommendations, to help with mood and sleep.  FeErinn Huskinsorain will reach out to [place] phone or MyChart with "next steps".  We will not schedule follow ups until we hear from him.  We appreciate the opportunity to participate in the care of FeMorganza All questions were answered. The patient knows to call the clinic with any problems, questions or concerns. No barriers to learning were detected.  I have spent a total of 40 minutes of face-to-face and non-face-to-face time, excluding clinical staff time, preparing to see patient, ordering tests and/or medications, counseling the patient, and independently interpreting results and communicating results to the patient/family/caregiver    ZaVentura SellersMD Medical Director of Neuro-Oncology CoB[facility] Park[facility] WeWeld9/13/21 12:44 PM

## 2020-07-22 ENCOUNTER — Inpatient Hospital Stay (HOSPITAL_BASED_OUTPATIENT_CLINIC_OR_DEPARTMENT_OTHER): Payer: Medicaid Other | Admitting: Internal Medicine

## 2020-07-22 DIAGNOSIS — Z7189 Other specified counseling: Secondary | ICD-10-CM

## 2020-07-22 DIAGNOSIS — C719 Malignant neoplasm of brain, unspecified: Secondary | ICD-10-CM | POA: Diagnosis not present

## 2020-07-22 DIAGNOSIS — R569 Unspecified convulsions: Secondary | ICD-10-CM | POA: Diagnosis not present

## 2020-07-22 MED ORDER — SERTRALINE HCL 25 MG PO TABS
25.0000 mg | ORAL_TABLET | Freq: Every day | ORAL | 3 refills | Status: AC
Start: 2020-07-22 — End: ?

## 2020-07-22 NOTE — Progress Notes (Signed)
I connected with Corey Rasmussen on 07/22/20 at 10:30 AM EDT by telephone visit and verified that I am speaking with the correct person using two identifiers.  I discussed the limitations, risks, security and privacy concerns of performing an evaluation and management service by telemedicine and the availability of in-person appointments. I also discussed with the patient that there may be a patient responsible charge related to this service. The patient expressed understanding and agreed to proceed.  Other persons participating in the visit and their role in the encounter:  sister  Patient's location:  Home  Provider's location:  Office  Chief Complaint:  Glioblastoma, IDH-wildtype (Corey Rasmussen)  Focal seizures ([place])  Goals of care, counseling/discussion  History of Present Ilness: Corey Rasmussen presents today for further discussion of goals of care.  He feels about the same as our prior visit, but overall very tired.  He would like to come home and is not interested in pursuing further treatments in the cancer center.  No recent seizures, he does request a medicine for depression. Observations: Language and cognition at baseline Assessment and Plan: Glioblastoma, IDH-wildtype ([place])  Focal seizures ([place])  Goals of care, counseling/discussion  We discussed goals of care extensively with Mr. Corey Rasmussen.  He has elected to pursue comfort care measures only, he does not wish to undergo treatment with chemotherapy.  He understands that a hospice referral will best provide for symptom based therapies and quality of life enhancing measures.  Follow Up Instructions: Referral to home hospice, will remain as primary attending during hospice services.  I discussed the assessment and treatment plan with the patient.  The patient was provided an opportunity to ask questions and all were answered.  The patient agreed with the plan and demonstrated understanding of the instructions.    The patient was advised to  call back or seek an in-person evaluation if the symptoms worsen or if the condition fails to improve as anticipated.  I provided 5-10 minutes of non-face-to-face time during this enocunter.  Corey Sellers, MD   I provided 15 minutes of non face-to-face telephone visit time during this encounter, and > 50% was spent counseling as documented under my assessment & plan.

## 2020-07-30 ENCOUNTER — Telehealth: Payer: Self-pay | Admitting: Medical Oncology

## 2020-07-30 NOTE — Telephone Encounter (Signed)
Pt update- On 07/22/20-Per sister, Reace is hospitalized at [facility] for "malnourishment, UTI " per sister. [facility] staff is talking to pt about Hospice. There is no discharge date. I told her to call back if she has any other concerns.   07/05/20 AVS put up front for sister to pick up.

## 2020-08-23 DEATH — deceased

## 2021-01-21 ENCOUNTER — Other Ambulatory Visit (HOSPITAL_COMMUNITY): Payer: Self-pay
